# Patient Record
Sex: Male | Born: 1995 | Race: White | Hispanic: No | Marital: Married | Smoking: Former smoker
Health system: Southern US, Community
[De-identification: ages and names within clinical notes are randomized; demographics above are authoritative.]

## PROBLEM LIST (undated history)

## (undated) DIAGNOSIS — F419 Anxiety disorder, unspecified: Secondary | ICD-10-CM

## (undated) DIAGNOSIS — E119 Type 2 diabetes mellitus without complications: Secondary | ICD-10-CM

## (undated) DIAGNOSIS — R197 Diarrhea, unspecified: Secondary | ICD-10-CM

## (undated) DIAGNOSIS — K589 Irritable bowel syndrome without diarrhea: Secondary | ICD-10-CM

## (undated) HISTORY — DX: Irritable bowel syndrome, unspecified: K58.9

## (undated) HISTORY — DX: Type 2 diabetes mellitus without complications: E11.9

## (undated) HISTORY — DX: Diarrhea, unspecified: R19.7

## (undated) HISTORY — PX: WISDOM TOOTH EXTRACTION: SHX21

## (undated) HISTORY — PX: TONSILLECTOMY: SUR1361

## (undated) HISTORY — DX: Anxiety disorder, unspecified: F41.9

---
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
## 2014-02-06 ENCOUNTER — Observation Stay: Payer: Self-pay | Admitting: Internal Medicine

## 2014-02-06 LAB — COMPREHENSIVE METABOLIC PANEL
ALT: 37 U/L (ref 12–78)
AST: 17 U/L (ref 15–37)
Albumin: 4.3 g/dL (ref 3.4–5.0)
Alkaline Phosphatase: 96 U/L
Anion Gap: 4 — ABNORMAL LOW (ref 7–16)
BILIRUBIN TOTAL: 0.8 mg/dL (ref 0.2–1.0)
BUN: 9 mg/dL (ref 7–18)
CALCIUM: 9.1 mg/dL (ref 8.5–10.1)
Chloride: 108 mmol/L — ABNORMAL HIGH (ref 98–107)
Co2: 27 mmol/L (ref 21–32)
Creatinine: 0.89 mg/dL (ref 0.60–1.30)
EGFR (African American): >60
EGFR (Non-African Amer.): >60
Glucose: 91 mg/dL (ref 65–99)
Osmolality: 276 (ref 275–301)
POTASSIUM: 3.8 mmol/L (ref 3.5–5.1)
SODIUM: 139 mmol/L (ref 136–145)
Total Protein: 7.6 g/dL (ref 6.4–8.2)

## 2014-02-06 LAB — URINALYSIS, COMPLETE
BACTERIA: NONE SEEN
Bilirubin,UR: NEGATIVE
Blood: NEGATIVE
Glucose,UR: NEGATIVE mg/dL (ref 0–75)
Ketone: NEGATIVE
LEUKOCYTE ESTERASE: NEGATIVE
Nitrite: NEGATIVE
PROTEIN: NEGATIVE
Ph: 7 (ref 4.5–8.0)
SPECIFIC GRAVITY: 1.02 (ref 1.003–1.030)
Squamous Epithelial: NONE SEEN
WBC UR: NONE SEEN /HPF (ref 0–5)

## 2014-02-06 LAB — CBC WITH DIFFERENTIAL/PLATELET
BASOS PCT: 0.3 %
Basophil #: 0 10*3/uL (ref 0.0–0.1)
EOS ABS: 0.1 10*3/uL (ref 0.0–0.7)
EOS PCT: 1.8 %
HCT: 46.8 % (ref 40.0–52.0)
HGB: 15.9 g/dL (ref 13.0–18.0)
LYMPHS ABS: 2 10*3/uL (ref 1.0–3.6)
LYMPHS PCT: 24.8 %
MCH: 27.9 pg (ref 26.0–34.0)
MCHC: 33.9 g/dL (ref 32.0–36.0)
MCV: 82 fL (ref 80–100)
MONOS PCT: 7 %
Monocyte #: 0.6 x10 3/mm (ref 0.2–1.0)
NEUTROS ABS: 5.2 10*3/uL (ref 1.4–6.5)
NEUTROS PCT: 66.1 %
PLATELETS: 209 10*3/uL (ref 150–440)
RBC: 5.7 10*6/uL (ref 4.40–5.90)
RDW: 12.7 % (ref 11.5–14.5)
WBC: 7.9 10*3/uL (ref 3.8–10.6)

## 2014-02-06 LAB — LIPID PANEL
CHOLESTEROL: 140 mg/dL (ref 0–200)
HDL Cholesterol: 27 mg/dL — ABNORMAL LOW (ref 40–60)
Ldl Cholesterol, Calc: 96 mg/dL (ref 0–100)
Triglycerides: 83 mg/dL (ref 0–200)
VLDL Cholesterol, Calc: 17 mg/dL (ref 5–40)

## 2014-02-06 LAB — LIPASE, BLOOD: LIPASE: 1305 U/L — AB (ref 73–393)

## 2014-02-07 LAB — BASIC METABOLIC PANEL
Anion Gap: 7 (ref 7–16)
BUN: 7 mg/dL (ref 7–18)
CALCIUM: 8.6 mg/dL (ref 8.5–10.1)
Chloride: 106 mmol/L (ref 98–107)
Co2: 26 mmol/L (ref 21–32)
Creatinine: 1.01 mg/dL (ref 0.60–1.30)
EGFR (Non-African Amer.): >60
GLUCOSE: 68 mg/dL (ref 65–99)
Osmolality: 274 (ref 275–301)
Potassium: 3.4 mmol/L — ABNORMAL LOW (ref 3.5–5.1)
Sodium: 139 mmol/L (ref 136–145)

## 2014-02-07 LAB — LIPID PANEL
CHOLESTEROL: 116 mg/dL (ref 0–200)
HDL Cholesterol: 22 mg/dL — ABNORMAL LOW (ref 40–60)
LDL CHOLESTEROL, CALC: 76 mg/dL (ref 0–100)
Triglycerides: 89 mg/dL (ref 0–200)
VLDL CHOLESTEROL, CALC: 18 mg/dL (ref 5–40)

## 2014-02-07 LAB — LIPASE, BLOOD: LIPASE: 651 U/L — AB (ref 73–393)

## 2014-02-10 ENCOUNTER — Other Ambulatory Visit (HOSPITAL_COMMUNITY): Payer: Self-pay | Admitting: Unknown Physician Specialty

## 2014-02-10 ENCOUNTER — Encounter (HOSPITAL_COMMUNITY): Payer: Self-pay

## 2014-02-10 ENCOUNTER — Ambulatory Visit (HOSPITAL_COMMUNITY): Payer: Managed Care, Other (non HMO)

## 2014-02-10 ENCOUNTER — Ambulatory Visit (HOSPITAL_COMMUNITY)
Admission: RE | Admit: 2014-02-10 | Discharge: 2014-02-10 | Disposition: A | Discharge status: Home / Self Care | Payer: Managed Care, Other (non HMO) | Source: Ambulatory Visit | Attending: Unknown Physician Specialty | Admitting: Unknown Physician Specialty

## 2014-02-10 DIAGNOSIS — R109 Unspecified abdominal pain: Secondary | ICD-10-CM | POA: Insufficient documentation

## 2014-02-10 DIAGNOSIS — R935 Abnormal findings on diagnostic imaging of other abdominal regions, including retroperitoneum: Secondary | ICD-10-CM

## 2014-02-10 DIAGNOSIS — R197 Diarrhea, unspecified: Secondary | ICD-10-CM | POA: Insufficient documentation

## 2014-02-10 DIAGNOSIS — R630 Anorexia: Secondary | ICD-10-CM | POA: Insufficient documentation

## 2014-02-10 MED ORDER — IOHEXOL 300 MG/ML  SOLN
100.0000 mL | Freq: Once | INTRAMUSCULAR | Status: AC | PRN
  Administered 2014-02-10: 100 mL via INTRAVENOUS

## 2014-03-24 ENCOUNTER — Ambulatory Visit: Payer: Self-pay | Admitting: Unknown Physician Specialty

## 2014-09-02 IMAGING — NM NUCLEAR MEDICINE HEPATOHBILIARY INCLUDE GB
2 series · 16 of 16 positions shown · non-contrast
Comparison: CT 02/10/2014.

CLINICAL DATA: History of pancreatitis.  RIGHT upper quadrant pain.

EXAM:
NUCLEAR MEDICINE HEPATOBILIARY IMAGING WITH GALLBLADDER EF
TECHNIQUE: Sequential images of the abdomen were obtained [DATE] minutes
following intravenous administration of radiopharmaceutical. After
slow intravenous infusion of 1.66 micrograms Cholecystokinin,
gallbladder ejection fraction was determined.
RADIOPHARMACEUTICALS:  8.36 Millicurie Qc-11m Choletec

[Series 1000: gallbladder ef · 4.80mm/px · 6 of 120 frames shown]
[frame 11/120]
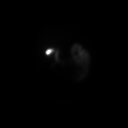
[frame 31/120]
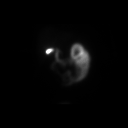
[frame 51/120]
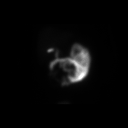
[frame 71/120]
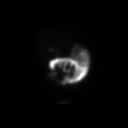
[frame 91/120]
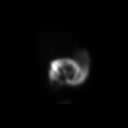
[frame 111/120]
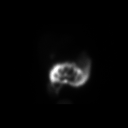

[Series 1000: gallbladder statics · 4.80mm/px · 10 of 10 slices shown]
[im 1/10]
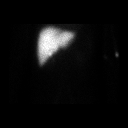
[im 2/10]
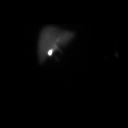
[im 3/10]
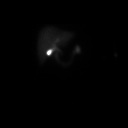
[im 4/10]
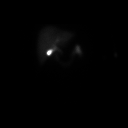
[im 5/10]
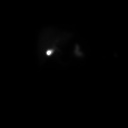
[im 6/10]
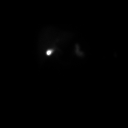
[im 7/10]
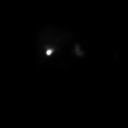
[im 8/10]
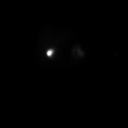
[im 9/10]
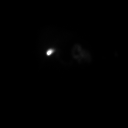
[im 10/10]
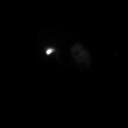

[16 of 16 positions shown; findings below may reference images not displayed]

FINDINGS: Normal hepatic radiotracer uptake. Normal filling of the common bile
duct and gallbladder. Common duct activity and gallbladder activity
were visualized at 3 min. Normal spill of radiotracer in the bowel.
Normal gallbladder ejection fraction with CCK administration.
Gallbladder ejection fraction was 95% at 30 min and 99% at 1 hr. At
30 min, normal ejection fraction is greater than 30%.

The patient did not symptoms during CCK infusion.
IMPRESSION: Normal HIDA scan.

## 2014-12-25 NOTE — H&P (Signed)
PATIENT NAME:  Juan Gonzales, BERENGUER MR#:  [id number] DATE OF BIRTH:  [birth year]  DATE OF ADMISSION:  02/06/2014  PRIMARY CARE PROVIDER:  Nonlocal.   CHIEF COMPLAINT:  Right upper quadrant abdominal pain.   HISTORY OF PRESENT ILLNESS:  The patient is a 19 year old white male with a history of irritable bowel syndrome who presents with abdominal pain ongoing for the past few days.  The patient thought that initially was related to his irritable bowel syndrome cramping; however, his pain continued to persist.  He reports the pain to be a constant dull pain on the right upper quadrant of his abdomen.  He had a laboratory evaluation in the ED which showed that his lipase was elevated and we are asked to admit the patient.  The patient reports that he has not had any nausea, vomiting or diarrhea.  He drinks beer once in a while socially.  He otherwise denies any previous history of pancreatitis.   PAST MEDICAL HISTORY:  History of irritable bowel syndrome.   PAST SURGICAL HISTORY:  Tonsillectomy.   ALLERGIES:  None.   MEDICATIONS:  None.   SOCIAL HISTORY:  Smokes less than 1 pack per day and drinks occasionally.  Uses marijuana from time to time.   FAMILY HISTORY:  Mother status post cholecystectomy due to gallstone related problems.   REVIEW OF SYSTEMS:  CONSTITUTIONAL:  Denies any fevers, fatigue.  Complains of abdominal pain.  EYES:  No blurred or double vision.  No pain.  No redness, no inflammation.  No glaucoma.  No cataracts.  EARS, NOSE, THROAT:  No tinnitus.  No ear pain.  No hearing loss.  No seasonal or year-round allergies.  RESPIRATORY:  Denies any cough, wheezing, hemoptysis.  CARDIOVASCULAR:  Denies any chest pain, orthopnea, edema.  GASTROINTESTINAL:  Denies any nausea, vomiting, diarrhea.  Complains of abdominal pain, has a history of irritable bowel syndrome. GENITOURINARY:  Denies any dysuria, hematuria, renal calculus. ENDOCRINE:  Denies any polyuria or nocturia.  HEMATOLOGIC  AND LYMPHATIC:  Denies anemia, easy bruisability or bleeding.  SKIN:  No acne.  No rash.  MUSCULOSKELETAL:  No pain in the neck, back, or shoulder. NEUROLOGIC:  No numbness, CVA, TIA.  PSYCHIATRIC:  No anxiety, insomnia.   PHYSICAL EXAMINATION: VITAL SIGNS:  Temperature 97.8, pulse 82, respirations 18, blood pressure 141/72, O2 99%.  GENERAL:  The patient is a well-developed, well-nourished male in no acute distress.  HEENT:  Head atraumatic, normocephalic.  Pupils equally round, reactive to light and accommodation.  There is no conjunctival pallor.  No scleral icterus.  Nasal exam shows no drainage or ulceration.  Oropharynx is clear without any exudate.  NECK:  Supple without any JVD.  CARDIOVASCULAR:  Regular rate and rhythm.  No murmurs, rubs, clicks, or gallops.  LUNGS:  Clear to auscultation bilaterally without any rales, rhonchi, wheezing.  ABDOMEN:  Right upper quadrant tenderness.  No guarding.  No rebound.  EXTREMITIES:  No clubbing, cyanosis, or edema.  SKIN:  No rash.  LYMPHATICS:  No lymph nodes palpable.  VASCULAR:  Good DP, PT pulses.  PSYCHIATRIC:  Not anxious or depressed.  NEUROLOGIC:  Awake, alert, oriented x 3.  No focal deficits.   LABORATORY, DIAGNOSTIC AND RADIOLOGICAL DATA:  Right upper quadrant ultrasound shows no evidence of cholelithiasis or biliary dilation.  Glucose 91, BUN 9, creatinine 0.89.  Sodium 139, potassium 3.8, chloride 108, CO2 27, lipase 1305.  LFTs are normal.  WBC 7.9, hemoglobin 15.9, platelet count 209.   ASSESSMENT AND  PLAN:  The patient is a 19 year old white male with a history of irritable bowel syndrome, presents with abdominal pain, noted to have elevated lipase.  1.  Abdominal pain, likely due to acute pancreatitis, etiology unclear.  At this time, we will keep him nothing by mouth.  We will get gastroenterology to come evaluate the patient.  Differential diagnosis could be hypertriglyceridemia.  We will check a fasting lipid panel in the  morning.  The patient will probably need serological evaluation for autoimmune pancreatitis.  CT of the abdomen is currently pending to further evaluate the pancreas.  2.  Nicotine addiction.  The patient counseled regarding smoking cessation, four minutes spent.  Nicotine patch offered and the patient will be started on a nicotine patch.   TIME SPENT:  35 minutes.    ____________________________ Lacie ScottsShreyang H. Allena KatzPatel, MD shp:ea D: 02/06/2014 21:51:59 ET T: 02/06/2014 23:11:12 ET JOB#: [id number]  cc: Adilynn Bessey H. Allena KatzPatel, MD, <Dictator> Charise CarwinSHREYANG H Noha Karasik MD ELECTRONICALLY SIGNED 02/16/2014 10:27

## 2014-12-25 NOTE — Discharge Summary (Signed)
PATIENT NAME:  Juan FloridaMURRAY, Sharad N MR#:  [id number] DATE OF BIRTH:  13-Aug-1996  DATE OF ADMISSION:  02/06/2014 DATE OF DISCHARGE:  02/07/2014  For a detailed note, please look at the history and physical done on admission by Dr. Auburn BilberryShreyang Patel.  DIAGNOSES AT DISCHARGE: Is as follows: 1. Suspected acute pancreatitis now ruled out.  2. History of irritable bowel syndrome with chronic diarrhea.   DIET: The patient is being discharged home on a regular diet.   ACTIVITY: As tolerated.   FOLLOWUP: With Dr. Lonie PeakNathan Conroy in the next 1 to 2 weeks.   DISCHARGE MEDICATIONS: Certrizine 10 mg daily, dicyclomine 10 mg 1 to 2 tabs t.i.d. as needed for diarrhea.   PERTINENT STUDIES DONE DURING THE HOSPITAL COURSE: Are as follows: A CT scan of the abdomen and pelvis done with contrast showing no evidence of acute pancreatitis or acute hepatobiliary abnormality. An ultrasound of the abdomen, limited, showing negative. No evidence of cholelithiasis or biliary dilatation. No evidence of pancreatitis.   BRIEF HOSPITAL COURSE: This is a 19 year old male who presented to the hospital with right upper quadrant abdominal pain and elevated lipase.   PROBLEMS: 1. Suspected acute pancreatitis. This was the clinical diagnosis on admission given the patient's abdominal pain and slightly elevated lipase. The patient was admitted to the hospital, started on supportive care with IV fluids, antiemetics, pain control. Although patient's imaging studies were not consistent with pancreatitis. The patient had no evidence of recent alcohol abuse. His triglyceride levels were normal. His imaging studies did not show any evidence of acute biliary pathology. Overnight, the patient's lipase did come down dramatically. He had no further pain; therefore, his diet was slowly advanced from a liquid eventually to a regular diet, which he is tolerating with no evidence of any nausea, vomiting, or any further abdominal pain. He was therefore  discharged home.  2. Irritable bowel syndrome with chronic diarrhea. The patient is on Bentyl which he will continue. He follows up with Dr. Mechele CollinElliott. He will continue to follow up with GI as an outpatient.   CODE STATUS: The patient is a full code.   TIME SPENT ON DISCHARGE: 35 minutes.     ____________________________ Rolly PancakeVivek J. Cherlynn KaiserSainani, MD vjs:lt D: 02/07/2014 14:47:19 ET T: 02/08/2014 02:13:05 ET JOB#: [id number]  cc: Rolly PancakeVivek J. Cherlynn KaiserSainani, MD, <Dictator> Lonie PeakNathan Conroy, MD Houston SirenVIVEK J SAINANI MD ELECTRONICALLY SIGNED 03/02/2014 20:38

## 2022-04-12 ENCOUNTER — Other Ambulatory Visit: Payer: Self-pay | Admitting: Family

## 2022-04-12 DIAGNOSIS — R197 Diarrhea, unspecified: Secondary | ICD-10-CM

## 2022-04-12 DIAGNOSIS — R110 Nausea: Secondary | ICD-10-CM

## 2022-04-12 DIAGNOSIS — R1011 Right upper quadrant pain: Secondary | ICD-10-CM

## 2022-04-13 ENCOUNTER — Ambulatory Visit
Admission: RE | Admit: 2022-04-13 | Discharge: 2022-04-13 | Disposition: A | Discharge status: Home / Self Care | Payer: BLUE CROSS/BLUE SHIELD | Source: Ambulatory Visit | Attending: Family | Admitting: Family

## 2022-04-13 DIAGNOSIS — R197 Diarrhea, unspecified: Secondary | ICD-10-CM | POA: Diagnosis present

## 2022-04-13 DIAGNOSIS — R110 Nausea: Secondary | ICD-10-CM

## 2022-04-13 DIAGNOSIS — R1011 Right upper quadrant pain: Secondary | ICD-10-CM | POA: Diagnosis present

## 2022-04-16 ENCOUNTER — Other Ambulatory Visit: Payer: Self-pay

## 2022-11-28 DIAGNOSIS — R140 Abdominal distension (gaseous): Secondary | ICD-10-CM | POA: Diagnosis not present

## 2022-11-28 DIAGNOSIS — K219 Gastro-esophageal reflux disease without esophagitis: Secondary | ICD-10-CM | POA: Diagnosis not present

## 2022-11-28 DIAGNOSIS — R1314 Dysphagia, pharyngoesophageal phase: Secondary | ICD-10-CM | POA: Diagnosis not present

## 2022-11-28 DIAGNOSIS — K582 Mixed irritable bowel syndrome: Secondary | ICD-10-CM | POA: Diagnosis not present

## 2022-11-28 DIAGNOSIS — R143 Flatulence: Secondary | ICD-10-CM | POA: Diagnosis not present

## 2022-11-28 DIAGNOSIS — R110 Nausea: Secondary | ICD-10-CM | POA: Diagnosis not present

## 2023-01-17 ENCOUNTER — Ambulatory Visit: Payer: 59 | Admitting: Nurse Practitioner

## 2023-01-17 ENCOUNTER — Other Ambulatory Visit: Payer: Self-pay

## 2023-01-17 ENCOUNTER — Encounter: Payer: Self-pay | Admitting: Nurse Practitioner

## 2023-01-17 VITALS — BP 122/78 | HR 84 | Temp 97.8°F | Resp 18 | Ht 71.0 in | Wt 213.5 lb

## 2023-01-17 DIAGNOSIS — G8929 Other chronic pain: Secondary | ICD-10-CM

## 2023-01-17 DIAGNOSIS — N529 Male erectile dysfunction, unspecified: Secondary | ICD-10-CM

## 2023-01-17 DIAGNOSIS — Z130 Encounter for screening for diseases of the blood and blood-forming organs and certain disorders involving the immune mechanism: Secondary | ICD-10-CM | POA: Diagnosis not present

## 2023-01-17 DIAGNOSIS — Z7189 Other specified counseling: Secondary | ICD-10-CM

## 2023-01-17 DIAGNOSIS — E1165 Type 2 diabetes mellitus with hyperglycemia: Secondary | ICD-10-CM

## 2023-01-17 DIAGNOSIS — Z131 Encounter for screening for diabetes mellitus: Secondary | ICD-10-CM

## 2023-01-17 DIAGNOSIS — J3089 Other allergic rhinitis: Secondary | ICD-10-CM

## 2023-01-17 DIAGNOSIS — F331 Major depressive disorder, recurrent, moderate: Secondary | ICD-10-CM | POA: Diagnosis not present

## 2023-01-17 DIAGNOSIS — R200 Anesthesia of skin: Secondary | ICD-10-CM | POA: Diagnosis not present

## 2023-01-17 DIAGNOSIS — K589 Irritable bowel syndrome without diarrhea: Secondary | ICD-10-CM | POA: Diagnosis not present

## 2023-01-17 DIAGNOSIS — F411 Generalized anxiety disorder: Secondary | ICD-10-CM

## 2023-01-17 DIAGNOSIS — M25512 Pain in left shoulder: Secondary | ICD-10-CM

## 2023-01-17 DIAGNOSIS — Z13220 Encounter for screening for lipoid disorders: Secondary | ICD-10-CM | POA: Diagnosis not present

## 2023-01-17 DIAGNOSIS — Z1159 Encounter for screening for other viral diseases: Secondary | ICD-10-CM

## 2023-01-17 DIAGNOSIS — Z114 Encounter for screening for human immunodeficiency virus [HIV]: Secondary | ICD-10-CM | POA: Diagnosis not present

## 2023-01-17 MED ORDER — SERTRALINE HCL 25 MG PO TABS
25.0000 mg | ORAL_TABLET | Freq: Every day | ORAL | 0 refills | Status: DC
Start: 2023-01-17 — End: 2023-03-01

## 2023-01-17 NOTE — Assessment & Plan Note (Signed)
established with gi

## 2023-01-17 NOTE — Assessment & Plan Note (Signed)
Start zoloft 25 mg daily 

## 2023-01-17 NOTE — Progress Notes (Signed)
BP 122/78   Pulse 84   Temp 97.8 F (36.6 C) (Oral)   Resp 18   Ht 5\' 11"  (1.803 m)   Wt 213 lb 8 oz (96.8 kg)   SpO2 96%   BMI 29.78 kg/m    Subjective:    Patient ID: Juan Gonzales, male    DOB: 12-Feb-1996, 27 y.o.   MRN: [id number]  HPI: Juan Gonzales is a 27 y.o. male  Chief Complaint  Patient presents with   Establish Care   Diabetes   Depression   Sperm Analysis   Establish care: his last physical was in October.  Medical history includes  Diabetes, allergies, depression, anxiety, chronic nausea, ibs, left shoulder pain, left arm numbness and low sperm count.   Health Maintenance due for labs.   Diabetes : he says he had an A1C of 12 in 2021. He says he was on metformin for about a month but did not tolerate it. His last A1C was 5.5 on 06/21/2021. He reports he was told he had diabetes.  He is not currently on medication.  He says he had a physical  in October and it was 5.2. will get labs.   Allergies:  he reports his allergies have been really bad. He says he has tried claritin and zyrtec.   He says that he has tried flonase.  He would like to see an allergist.  Referral placed.   Depression/anxiety: his PHQ9 and GAD scores are elevated.   he reports that he has tried zoloft, prozac, and other medications but has a hard time sticking with it.  He has agreed to start zoloft 25 mg daily. He will follow up in 4 weeks.      01/17/2023    2:38 PM  Depression screen PHQ 2/9  Decreased Interest 2  Down, Depressed, Hopeless 2  PHQ - 2 Score 4  Altered sleeping 3  Tired, decreased energy 3  Change in appetite 2  Feeling bad or failure about yourself  3  Trouble concentrating 3  Moving slowly or fidgety/restless 0  Suicidal thoughts 0  PHQ-9 Score 18  Difficult doing work/chores Somewhat difficult       01/17/2023    2:40 PM  GAD 7 : Generalized Anxiety Score  Nervous, Anxious, on Edge 3  Control/stop worrying 3  Worry too much - different things 3   Trouble relaxing 3  Restless 0  Easily annoyed or irritable 1  Afraid - awful might happen 3  Total GAD 7 Score 16  Anxiety Difficulty Somewhat difficult   Chronic nausea/globus sensation/IBS: he says that he has a globus sensation for about a year. He says this has caused a lot of nausea.   He has an appointment for endoscopy.  He is currently seeing GI [facility] clinic.    Left arm numbness/left shoulder pain:  he says that his left arm numbness.  He says that it has been going on for about 4 months. He has had chronic shoulder problems.  He has a torn rotator cuff.  Recommend going back to orthopedic.    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  He went to a fertility clinic and was tested.  He says that he would like to have that rechecked and he would also like to have his testosterone checked.   Relevant past medical, surgical, family and social history reviewed and updated as indicated. Interim medical history since our last  visit reviewed. Allergies and medications reviewed and updated.  Review of Systems  Constitutional: Negative for fever or weight change.  Respiratory: Negative for cough and shortness of breath.   Cardiovascular: Negative for chest pain or palpitations.  Gastrointestinal: Negative for abdominal pain, no bowel changes.  Musculoskeletal: Negative for gait problem or joint swelling.  Skin: Negative for rash.  Neurological: Negative for dizziness or headache.  No other specific complaints in a complete review of systems (except as listed in HPI above).      Objective:    BP 122/78   Pulse 84   Temp 97.8 F (36.6 C) (Oral)   Resp 18   Ht 5\' 11"  (1.803 m)   Wt 213 lb 8 oz (96.8 kg)   SpO2 96%   BMI 29.78 kg/m   Wt Readings from Last 3 Encounters:  01/17/23 213 lb 8 oz (96.8 kg)  07/09/11 196 lb (88.9 kg) (94 %, Z= 1.54)*  05/09/11 197 lb (89.4 kg) (94 %, Z= 1.59)*   * Growth percentiles are based on CDC  (Boys, 2-20 Years) data.    Physical Exam  Constitutional: Patient appears well-developed and well-nourished. Obese  No distress.  HEENT: head atraumatic, normocephalic, pupils equal and reactive to light, neck supple Cardiovascular: Normal rate, regular rhythm and normal heart sounds.  No murmur heard. No BLE edema. Pulmonary/Chest: Effort normal and breath sounds normal. No respiratory distress. Abdominal: Soft.  There is no tenderness. Psychiatric: Patient has a normal mood and affect. behavior is normal. Judgment and thought content normal.     Assessment & Plan:   Problem List Items Addressed This Visit       Digestive   Irritable bowel syndrome (IBS)    established with gi         Other   GAD (generalized anxiety disorder)    Start zoloft 25 mg daily      Relevant Medications   ALPRAZolam (NIRAVAM) 0.25 MG dissolvable tablet   sertraline (ZOLOFT) 25 MG tablet   Moderate episode of recurrent major depressive disorder (HCC)    Start zoloft 25 mg daily      Relevant Medications   ALPRAZolam (NIRAVAM) 0.25 MG dissolvable tablet   sertraline (ZOLOFT) 25 MG tablet   Other Visit Diagnoses     Type 2 diabetes mellitus with hyperglycemia, without long-term current use of insulin (HCC)    -  Primary   Relevant Orders   COMPLETE METABOLIC PANEL WITH GFR   Hemoglobin A1c   Microalbumin / creatinine urine ratio   Encounter for HIV screening and discussion of pre-exposure prophylaxis for HIV       Relevant Orders   HIV Antibody (routine testing w rflx)   Need for hepatitis C screening test       Relevant Orders   Hepatitis C antibody   Screening for diabetes mellitus       Relevant Orders   COMPLETE METABOLIC PANEL WITH GFR   Hemoglobin A1c   Screening for deficiency anemia       Relevant Orders   CBC with Differential/Platelet   Screening for cholesterol level       Relevant Orders   Lipid panel   Chronic left shoulder pain       referral placed to orthopedic    Relevant Medications   sertraline (ZOLOFT) 25 MG tablet   Other Relevant Orders   Ambulatory referral to Orthopedic Surgery   Allergic rhinitis due to other allergic trigger, unspecified seasonality  referral for allergy testing placed   Relevant Orders   Ambulatory referral to Allergy   Erectile dysfunction, unspecified erectile dysfunction type       reports difficulty with erection and was told he had a low sperm count,would like testosterone checked.   Relevant Orders   Testosterone   TSH   Arm numbness left       referral placed to orthopedic   Relevant Orders   Vitamin B12        Follow up plan: Return in about 4 weeks (around 02/14/2023) for follow up.

## 2023-01-18 ENCOUNTER — Encounter: Payer: Self-pay | Admitting: Nurse Practitioner

## 2023-01-18 LAB — COMPLETE METABOLIC PANEL WITH GFR
BUN: 13 mg/dL (ref 7–25)
Globulin: 2.8 g/dL (calc) (ref 1.9–3.7)

## 2023-01-18 LAB — HEMOGLOBIN A1C: Hgb A1c MFr Bld: 5.4 % of total Hgb (ref <5.7)

## 2023-01-18 LAB — LIPID PANEL
Non-HDL Cholesterol (Calc): 145 mg/dL (calc) — ABNORMAL HIGH (ref <130)
Total CHOL/HDL Ratio: 5.5 (calc) — ABNORMAL HIGH (ref <5.0)

## 2023-01-18 LAB — HIV ANTIBODY (ROUTINE TESTING W REFLEX): HIV 1&2 Ab, 4th Generation: NONREACTIVE

## 2023-01-19 LAB — MICROALBUMIN / CREATININE URINE RATIO
Creatinine, Urine: 228 mg/dL (ref 20–320)
Microalb Creat Ratio: 7 mg/g creat (ref <30)
Microalb, Ur: 1.6 mg/dL

## 2023-01-19 LAB — CBC WITH DIFFERENTIAL/PLATELET
Absolute Monocytes: 432 cells/uL (ref 200–950)
Basophils Absolute: 60 cells/uL (ref 0–200)
Basophils Relative: 1 %
Eosinophils Absolute: 138 cells/uL (ref 15–500)
Eosinophils Relative: 2.3 %
HCT: 51.2 % — ABNORMAL HIGH (ref 38.5–50.0)
Hemoglobin: 17.2 g/dL — ABNORMAL HIGH (ref 13.2–17.1)
Lymphs Abs: 1806 cells/uL (ref 850–3900)
MCH: 27.2 pg (ref 27.0–33.0)
MCHC: 33.6 g/dL (ref 32.0–36.0)
MCV: 81 fL (ref 80.0–100.0)
MPV: 9.5 fL (ref 7.5–12.5)
Monocytes Relative: 7.2 %
Neutro Abs: 3564 cells/uL (ref 1500–7800)
Neutrophils Relative %: 59.4 %
Platelets: 240 10*3/uL (ref 140–400)
RBC: 6.32 10*6/uL — ABNORMAL HIGH (ref 4.20–5.80)
RDW: 12.3 % (ref 11.0–15.0)
Total Lymphocyte: 30.1 %
WBC: 6 10*3/uL (ref 3.8–10.8)

## 2023-01-19 LAB — TESTOSTERONE: Testosterone: 276 ng/dL (ref 250–827)

## 2023-01-19 LAB — LIPID PANEL
Cholesterol: 177 mg/dL (ref <200)
HDL: 32 mg/dL — ABNORMAL LOW (ref >=40)
LDL Cholesterol (Calc): 123 mg/dL (calc) — ABNORMAL HIGH
Triglycerides: 111 mg/dL (ref <150)

## 2023-01-19 LAB — COMPLETE METABOLIC PANEL WITH GFR
AG Ratio: 1.8 (calc) (ref 1.0–2.5)
ALT: 27 U/L (ref 9–46)
AST: 17 U/L (ref 10–40)
Albumin: 5 g/dL (ref 3.6–5.1)
Alkaline phosphatase (APISO): 63 U/L (ref 36–130)
CO2: 25 mmol/L (ref 20–32)
Calcium: 9.6 mg/dL (ref 8.6–10.3)
Chloride: 106 mmol/L (ref 98–110)
Creat: 0.92 mg/dL (ref 0.60–1.24)
Glucose, Bld: 101 mg/dL — ABNORMAL HIGH (ref 65–99)
Potassium: 4.3 mmol/L (ref 3.5–5.3)
Sodium: 141 mmol/L (ref 135–146)
Total Bilirubin: 0.8 mg/dL (ref 0.2–1.2)
Total Protein: 7.8 g/dL (ref 6.1–8.1)
eGFR: 115 mL/min/{1.73_m2} (ref >=60)

## 2023-01-19 LAB — HEMOGLOBIN A1C
Mean Plasma Glucose: 108 mg/dL
eAG (mmol/L): 6 mmol/L

## 2023-01-19 LAB — TSH: TSH: 1.15 mIU/L (ref 0.40–4.50)

## 2023-01-19 LAB — HEPATITIS C ANTIBODY: Hepatitis C Ab: NONREACTIVE

## 2023-01-19 LAB — VITAMIN B12: Vitamin B-12: 440 pg/mL (ref 200–1100)

## 2023-01-21 ENCOUNTER — Other Ambulatory Visit: Payer: Self-pay | Admitting: Nurse Practitioner

## 2023-01-21 DIAGNOSIS — N529 Male erectile dysfunction, unspecified: Secondary | ICD-10-CM

## 2023-01-29 ENCOUNTER — Encounter: Payer: Self-pay | Admitting: Internal Medicine

## 2023-01-30 ENCOUNTER — Ambulatory Visit: Payer: 59 | Admitting: Certified Registered"

## 2023-01-30 ENCOUNTER — Encounter
Admission: RE | Disposition: A | Discharge status: Home / Self Care | Payer: Self-pay | Source: Home / Self Care | Attending: Internal Medicine

## 2023-01-30 ENCOUNTER — Ambulatory Visit
Admission: RE | Admit: 2023-01-30 | Discharge: 2023-01-30 | Disposition: A | Discharge status: Home / Self Care | Payer: 59 | Attending: Internal Medicine | Admitting: Internal Medicine

## 2023-01-30 ENCOUNTER — Encounter: Payer: Self-pay | Admitting: Internal Medicine

## 2023-01-30 ENCOUNTER — Other Ambulatory Visit: Payer: Self-pay

## 2023-01-30 DIAGNOSIS — F329 Major depressive disorder, single episode, unspecified: Secondary | ICD-10-CM | POA: Diagnosis not present

## 2023-01-30 DIAGNOSIS — E119 Type 2 diabetes mellitus without complications: Secondary | ICD-10-CM | POA: Diagnosis not present

## 2023-01-30 DIAGNOSIS — R1310 Dysphagia, unspecified: Secondary | ICD-10-CM | POA: Diagnosis not present

## 2023-01-30 DIAGNOSIS — R1314 Dysphagia, pharyngoesophageal phase: Secondary | ICD-10-CM | POA: Diagnosis not present

## 2023-01-30 DIAGNOSIS — R110 Nausea: Secondary | ICD-10-CM | POA: Insufficient documentation

## 2023-01-30 DIAGNOSIS — K589 Irritable bowel syndrome without diarrhea: Secondary | ICD-10-CM | POA: Diagnosis not present

## 2023-01-30 DIAGNOSIS — K2289 Other specified disease of esophagus: Secondary | ICD-10-CM | POA: Insufficient documentation

## 2023-01-30 DIAGNOSIS — F419 Anxiety disorder, unspecified: Secondary | ICD-10-CM | POA: Diagnosis not present

## 2023-01-30 DIAGNOSIS — K219 Gastro-esophageal reflux disease without esophagitis: Secondary | ICD-10-CM | POA: Diagnosis not present

## 2023-01-30 HISTORY — PX: ESOPHAGOGASTRODUODENOSCOPY (EGD) WITH PROPOFOL: SHX5813

## 2023-01-30 SURGERY — ESOPHAGOGASTRODUODENOSCOPY (EGD) WITH PROPOFOL
Anesthesia: General

## 2023-01-30 MED ORDER — ONDANSETRON HCL 4 MG/2ML IJ SOLN
INTRAMUSCULAR | Status: DC | PRN
  Administered 2023-01-30: 4 mg via INTRAVENOUS

## 2023-01-30 MED ORDER — PROPOFOL 10 MG/ML IV BOLUS
INTRAVENOUS | Status: AC
  Filled 2023-01-30: qty 20

## 2023-01-30 MED ORDER — PROPOFOL 500 MG/50ML IV EMUL
INTRAVENOUS | Status: DC | PRN
  Administered 2023-01-30: 200 ug/kg/min via INTRAVENOUS
  Administered 2023-01-30: 150 mg via INTRAVENOUS

## 2023-01-30 MED ORDER — SODIUM CHLORIDE 0.9 % IV SOLN: INTRAVENOUS | Status: DC

## 2023-01-30 MED ORDER — DEXMEDETOMIDINE HCL IN NACL 80 MCG/20ML IV SOLN
INTRAVENOUS | Status: DC | PRN
  Administered 2023-01-30: 20 ug via INTRAVENOUS

## 2023-01-30 MED ORDER — GLYCOPYRROLATE 0.2 MG/ML IJ SOLN
INTRAMUSCULAR | Status: DC | PRN
  Administered 2023-01-30: .2 mg via INTRAVENOUS

## 2023-01-30 NOTE — Transfer of Care (Signed)
Immediate Anesthesia Transfer of Care Note  Patient: Juan Gonzales  Procedure(s) Performed: ESOPHAGOGASTRODUODENOSCOPY (EGD) WITH PROPOFOL  Patient Location: PACU  Anesthesia Type:MAC  Level of Consciousness: drowsy  Airway & Oxygen Therapy: Patient Spontanous Breathing  Post-op Assessment: Report given to RN and Post -op Vital signs reviewed and stable  Post vital signs: Reviewed  Last Vitals:  Vitals Value Taken Time  BP 124/86   Temp 98   Pulse 80 01/30/23 1248  Resp 11 01/30/23 1248  SpO2 96 % 01/30/23 1248  Vitals shown include unvalidated device data.  Last Pain:  Vitals:   01/30/23 1246  TempSrc:   PainSc: 0-No pain         Complications: No notable events documented.

## 2023-01-30 NOTE — H&P (Signed)
Outpatient short stay form Pre-procedure 01/30/2023 11:53 AM Juan Gonzales K. Norma Fredrickson, M.D.  Primary Physician: Della Goo, FNP  Reason for visit:  Chronic nausea  Pharyngoesophageal dysphagia  Gastroesophageal reflux disease, unspecified whether esophagitis present   History of present illness:  He reports over the past 85-months he has been experiencing issues with chronic nausea, abdominal bloating, excessive flatulence, and alternating constipation and diarrhea. He reports he was on oral antibiotics for sinus infection prior to getting the distressing GI symptoms. There is no associated emesis with the nausea. He reports the nausea is very bothersome as he can wake up with it and feel terrible to start the day. It can make it hard to go to sleep at night. It can be worse after meals. When symptoms first started, "it felt like my abs had just done a full ab work out." He can feel like something is sitting in his throat with a lot of gas build up and then will belch and feel better. He endorses intermittent abdominal cramping and uncontrolled IBS symptoms. Bowels are 60% diarrhea and 40% constipation. He typically has 3-4 bowel movements daily with consistency similar to [facility] Stool scale 5-6. Flatulence and bloating make it hard to feel normal. He reports his stool "smells like sulfur and eggs." GERD symptoms are fairly well-controlled on PPI. He also notes a 6-8 month history of solid food dysphagia where he feels like breads, meats, or other dried thing hang up at level between cricoid cartilage and suprasternal notch. Once he drinks some liquid the food bolus will usually pass into his stomach. He denies any issues with globus sensation, odynophagia, liquid/pill dysphagia, or hoarseness. He was diagnosed with T2DM two years ago and really focused on diet. He was ~275-lbs at time of diabetes diagnosis and got down to 200-lbs. He is now between 205-215-lbs. He fasts in the morning and eats his first meal  around 2 PM. He will have snack between 5-6 PM and eat dinner at 9 PM after work. He usually goes to bed at 3 AM. He works as Corporate treasurer. He vapes daily, but reports it is 0% nicotine. He does use marijuana daily. Anxiety symptoms have been uncontrolled recently due to exacerbation of GI symptoms. His parents both have IBS. No known family history of gastrointestinal malignancies or IBD. He had EGD and colonoscopy performed by Dr. Mechele Collin 11/2012 which commented on LA Grade A reflux esophagitis, normal stomach/duodenum, normal appearing TI with negative TI biopsies, and normal examined colon with random biopsies negative for microscopic colitis. He had VC Eperformed 02/2014 which showed no structural abnormalities. 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 He was given saline bolus and Reglan 5 mg IV.     Current Facility-Administered Medications:    0.9 %  sodium chloride infusion, , Intravenous, Continuous, Kevyn Boquet, Boykin Nearing, MD  Medications Prior to Admission  Medication Sig Dispense Refill Last Dose   ALPRAZolam (NIRAVAM) 0.25 MG dissolvable tablet Take 0.25 mg by mouth at bedtime as needed for anxiety.   Past Week   sertraline (ZOLOFT) 25 MG tablet Take 1 tablet (25 mg total) by mouth daily. 30 tablet 0      Allergies  Allergen Reactions   Octacosanol    Octacosanol    Hydrocodone Rash     Past Medical History:  Diagnosis Date   Abdominal pain    Diabetes mellitus without complication (HCC)  Diarrhea    IBS (irritable bowel syndrome)    presumptive    Review of systems:  Otherwise negative.    Physical Exam  Gen: Alert, oriented. Appears stated age.  HEENT: [place]/AT. PERRLA. Lungs: CTA, no wheezes. CV: RR nl S1, S2. Abd: soft, benign, no masses. BS+ Ext: No edema. Pulses 2+    Planned procedures: Proceed with EGD. The patient understands the nature of the  planned procedure, indications, risks, alternatives and potential complications including but not limited to bleeding, infection, perforation, damage to internal organs and possible oversedation/side effects from anesthesia. The patient agrees and gives consent to proceed.  Please refer to procedure notes for findings, recommendations and patient disposition/instructions.     Tamee Battin K. Norma Fredrickson, M.D. Gastroenterology 01/30/2023  11:53 AM

## 2023-01-30 NOTE — Interval H&P Note (Signed)
History and Physical Interval Note:  01/30/2023 11:59 AM  Juan Gonzales  has presented today for surgery, with the diagnosis of  787.02 (ICD-9-CM) - R11.0 (ICD-10-CM) - Chronic nausea  787.24 (ICD-9-CM) - R13.14 (ICD-10-CM) - Pharyngoesophageal dysphagia  530.81 (ICD-9-CM) - K21.9 (ICD-10-CM) - Gastroesophageal reflux disease, unspecified whether esophagitis present.  The various methods of treatment have been discussed with the patient and family. After consideration of risks, benefits and other options for treatment, the patient has consented to  Procedure(s): ESOPHAGOGASTRODUODENOSCOPY (EGD) WITH PROPOFOL (N/A) as a surgical intervention.  The patient's history has been reviewed, patient examined, no change in status, stable for surgery.  I have reviewed the patient's chart and labs.  Questions were answered to the patient's satisfaction.     [place], [place]

## 2023-01-30 NOTE — Op Note (Addendum)
[facility] Gastroenterology Patient Name: Juan Gonzales Procedure Date: 01/30/2023 12:09 PM MRN: [id number] Account #: [id number] Date of Birth: [birth year] Admit Type: Outpatient Age: 27 Room[facility] ENDO ROOM 2 Gender: Male Note Status: Finalized Instrument Name: Upper Endoscope [id number] Procedure:             Upper GI endoscopy Indications:           Dysphagia, Suspected gastro-esophageal reflux disease Providers:             Boykin Nearing. Norma Fredrickson MD, MD Referring MD:          Rudolpho Sevin. [place] (Referring MD) Medicines:             Propofol per Anesthesia Complications:         No immediate complications. Procedure:             Pre-Anesthesia Assessment:                        - The risks and benefits of the procedure and the                         sedation options and risks were discussed with the                         patient. All questions were answered and informed                         consent was obtained.                        - Patient identification and proposed procedure were                         verified prior to the procedure by the nurse. The                         procedure was verified in the procedure room.                        - ASA Grade Assessment: II - A patient with mild                         systemic disease.                        - After reviewing the risks and benefits, the patient                         was deemed in satisfactory condition to undergo the                         procedure.                        After obtaining informed consent, the endoscope was                         passed under direct vision. Throughout the procedure,  the patient's blood pressure, pulse, and oxygen                         saturations were monitored continuously. The Endoscope                         was introduced through the mouth, and advanced to the                         third part of duodenum. The upper GI  endoscopy was                         accomplished without difficulty. The patient tolerated                         the procedure well. Findings:      The Z-line was irregular and was found 35 to 36 cm from the incisors.       Mucosa was biopsied with a cold forceps for histology. One specimen       bottle was sent to pathology.      No endoscopic abnormality was evident in the esophagus to explain the       patient's complaint of dysphagia. It was decided, however, to proceed       with dilation in the distal esophagus. The scope was withdrawn. Dilation       was performed with a Maloney dilator with no resistance at 54 Fr.      The stomach was normal.      The examined duodenum was normal. Impression:            - Z-line irregular, 35 to 36 cm from the incisors.                         Biopsied.                        - No endoscopic esophageal abnormality to explain                         patient's dysphagia. Esophagus dilated. Dilated.                        - Normal stomach.                        - Normal examined duodenum. Recommendation:        - Patient has a contact number available for                         emergencies. The signs and symptoms of potential                         delayed complications were discussed with the patient.                         Return to normal activities tomorrow. Written                         discharge instructions were provided to the patient.                        -  Resume previous diet.                        - Continue present medications.                        - Await pathology results.                        - Monitor results to esophageal dilation                        - Follow up with Jacob Moores, PA-C in the GI office.                         [phone]                        - Telephone GI office to schedule appointment in 3                         months.                        - The findings and recommendations were  discussed with                         the patient. Procedure Code(s):     --- Professional ---                        [phone], Esophagogastroduodenoscopy, flexible,                         transoral; with biopsy, single or multiple                        [phone], Dilation of esophagus, by unguided sound or                         bougie, single or multiple passes Diagnosis Code(s):     --- Professional ---                        R13.10, Dysphagia, unspecified                        K22.89, Other specified disease of esophagus CPT copyright 2022 American Medical Association. All rights reserved. The codes documented in this report are preliminary and upon coder review may  be revised to meet current compliance requirements. Stanton Kidney MD, MD 01/30/2023 12:44:28 PM This report has been signed electronically. Number of Addenda: 0 Note Initiated On: 01/30/2023 12:09 PM Estimated Blood Loss:  Estimated blood loss: none.      [facility]

## 2023-01-30 NOTE — Anesthesia Preprocedure Evaluation (Signed)
Anesthesia Evaluation  Patient identified by MRN, date of birth, ID band Patient awake    Reviewed: Allergy & Precautions, H&P , NPO status , Patient's Chart, lab work & pertinent test results, reviewed documented beta blocker date and time   Airway Mallampati: II   Neck ROM: full    Dental  (+) Poor Dentition   Pulmonary Current SmokerPatient did not abstain from smoking.   Pulmonary exam normal        Cardiovascular Exercise Tolerance: Good negative cardio ROS Normal cardiovascular exam Rhythm:regular Rate:Normal     Neuro/Psych  PSYCHIATRIC DISORDERS Anxiety Depression    negative neurological ROS     GI/Hepatic negative GI ROS, Neg liver ROS,,,  Endo/Other  negative endocrine ROSdiabetes, Well Controlled    Renal/GU negative Renal ROS  negative genitourinary   Musculoskeletal   Abdominal   Peds  Hematology negative hematology ROS (+)   Anesthesia Other Findings Past Medical History: No date: Abdominal pain No date: Diabetes mellitus without complication (HCC) No date: Diarrhea No date: IBS (irritable bowel syndrome)     Comment:  presumptive Past Surgical History: No date: TONSILLECTOMY No date: WISDOM TOOTH EXTRACTION BMI    Body Mass Index: 29.29 kg/m     Reproductive/Obstetrics negative OB ROS                             Anesthesia Physical Anesthesia Plan  ASA: 2  Anesthesia Plan: General   Post-op Pain Management:    Induction:   PONV Risk Score and Plan:   Airway Management Planned:   Additional Equipment:   Intra-op Plan:   Post-operative Plan:   Informed Consent: I have reviewed the patients History and Physical, chart, labs and discussed the procedure including the risks, benefits and alternatives for the proposed anesthesia with the patient or authorized representative who has indicated his/her understanding and acceptance.     Dental Advisory  Given  Plan Discussed with: CRNA  Anesthesia Plan Comments:        Anesthesia Quick Evaluation

## 2023-01-31 ENCOUNTER — Encounter: Payer: Self-pay | Admitting: Internal Medicine

## 2023-01-31 ENCOUNTER — Other Ambulatory Visit: Payer: Self-pay | Admitting: Nurse Practitioner

## 2023-01-31 DIAGNOSIS — F331 Major depressive disorder, recurrent, moderate: Secondary | ICD-10-CM

## 2023-01-31 DIAGNOSIS — F411 Generalized anxiety disorder: Secondary | ICD-10-CM

## 2023-01-31 NOTE — Anesthesia Postprocedure Evaluation (Signed)
Anesthesia Post Note  Patient: Juan Gonzales  Procedure(s) Performed: ESOPHAGOGASTRODUODENOSCOPY (EGD) WITH PROPOFOL  Patient location during evaluation: PACU Anesthesia Type: General Level of consciousness: awake and alert Pain management: pain level controlled Vital Signs Assessment: post-procedure vital signs reviewed and stable Respiratory status: spontaneous breathing, nonlabored ventilation, respiratory function stable and patient connected to nasal cannula oxygen Cardiovascular status: blood pressure returned to baseline and stable Postop Assessment: no apparent nausea or vomiting Anesthetic complications: no   No notable events documented.   Last Vitals:  Vitals:   01/30/23 1246 01/30/23 1256  BP: 114/73 133/81  Pulse:    Resp:    Temp:    SpO2:      Last Pain:  Vitals:   01/30/23 1306  TempSrc:   PainSc: 1                  Yevette Edwards

## 2023-01-31 NOTE — Telephone Encounter (Signed)
Unable to refill per protocol, Rx request is too soon. Last refill 01/17/23 for 30 days.  Requested Prescriptions  Pending Prescriptions Disp Refills   sertraline (ZOLOFT) 25 MG tablet [Pharmacy Med Name: SERTRALINE HCL 25 MG TABLET] 90 tablet 1    Sig: TAKE 1 TABLET (25 MG TOTAL) BY MOUTH DAILY.     Psychiatry:  Antidepressants - SSRI - sertraline Passed - 01/31/2023  9:16 AM      Passed - AST in normal range and within 360 days    AST  Date Value Ref Range Status  01/17/2023 17 10 - 40 U/L Final   SGOT(AST)  Date Value Ref Range Status  02/06/2014 17 15 - 37 Unit/L Final         Passed - ALT in normal range and within 360 days    ALT  Date Value Ref Range Status  01/17/2023 27 9 - 46 U/L Final   SGPT (ALT)  Date Value Ref Range Status  02/06/2014 37 12 - 78 U/L Final         Passed - Completed PHQ-2 or PHQ-9 in the last 360 days      Passed - Valid encounter within last 6 months    Recent Outpatient Visits           2 weeks ago Type 2 diabetes mellitus with hyperglycemia, without long-term current use of insulin [facility])   [place] [facility] Berniece Salines, FNP       Future Appointments             In 2 weeks Zane Herald, Rudolpho Sevin, FNP [facility]   In 4 weeks Stoioff, Verna Czech, MD [facility] [place]

## 2023-02-06 ENCOUNTER — Encounter: Payer: Self-pay | Admitting: Nurse Practitioner

## 2023-02-13 NOTE — Progress Notes (Deleted)
There were no vitals taken for this visit.   Subjective:    Patient ID: Juan Gonzales, male    DOB: 10-Jul-1996, 27 y.o.   MRN: [id number]  HPI: Juan Gonzales is a 27 y.o. male  No chief complaint on file.  Depression/anxiety Medication zoloft 25 mg daily Compliant *** Side effects *** PHQ9 *** GAD *** Therapy ***   Depression/anxiety: his PHQ9 and GAD scores are elevated.   he reports that he has tried zoloft, prozac, and other medications but has a hard time sticking with it.  He has agreed to start zoloft 25 mg daily. He will follow up in 4 weeks.   Relevant past medical, surgical, family and social history reviewed and updated as indicated. Interim medical history since our last visit reviewed. Allergies and medications reviewed and updated.  Review of Systems  Constitutional: Negative for fever or weight change.  Respiratory: Negative for cough and shortness of breath.   Cardiovascular: Negative for chest pain or palpitations.  Gastrointestinal: Negative for abdominal pain, no bowel changes.  Musculoskeletal: Negative for gait problem or joint swelling.  Skin: Negative for rash.  Neurological: Negative for dizziness or headache.  No other specific complaints in a complete review of systems (except as listed in HPI above).      Objective:    There were no vitals taken for this visit.  Wt Readings from Last 3 Encounters:  01/30/23 210 lb (95.3 kg)  01/17/23 213 lb 8 oz (96.8 kg)  07/09/11 196 lb (88.9 kg) (94 %, Z= 1.54)*   * Growth percentiles are based on CDC (Boys, 2-20 Years) data.    Physical Exam  Constitutional: Patient appears well-developed and well-nourished. Obese *** No distress.  HEENT: head atraumatic, normocephalic, pupils equal and reactive to light, ears ***, neck supple, throat within normal limits Cardiovascular: Normal rate, regular rhythm and normal heart sounds.  No murmur heard. No BLE edema. Pulmonary/Chest: Effort normal and  breath sounds normal. No respiratory distress. Abdominal: Soft.  There is no tenderness. Psychiatric: Patient has a normal mood and affect. behavior is normal. Judgment and thought content normal.  Results for orders placed or performed in visit on 01/17/23  Testosterone  Result Value Ref Range   Testosterone 276 250 - 827 ng/dL  CBC with Differential/Platelet  Result Value Ref Range   WBC 6.0 3.8 - 10.8 Thousand/uL   RBC 6.32 (H) 4.20 - 5.80 Million/uL   Hemoglobin 17.2 (H) 13.2 - 17.1 g/dL   HCT 40.9 (H) 81.1 - 91.4 %   MCV 81.0 80.0 - 100.0 fL   MCH 27.2 27.0 - 33.0 pg   MCHC 33.6 32.0 - 36.0 g/dL   RDW 78.2 95.6 - 21.3 %   Platelets 240 140 - 400 Thousand/uL   MPV 9.5 7.5 - 12.5 fL   Neutro Abs 3,564 1,500 - 7,800 cells/uL   Lymphs Abs 1,806 850 - 3,900 cells/uL   Absolute Monocytes 432 200 - 950 cells/uL   Eosinophils Absolute 138 15 - 500 cells/uL   Basophils Absolute 60 0 - 200 cells/uL   Neutrophils Relative % 59.4 %   Total Lymphocyte 30.1 %   Monocytes Relative 7.2 %   Eosinophils Relative 2.3 %   Basophils Relative 1.0 %  COMPLETE METABOLIC PANEL WITH GFR  Result Value Ref Range   Glucose, Bld 101 (H) 65 - 99 mg/dL   BUN 13 7 - 25 mg/dL   Creat 0.86 5.78 - 4.69 mg/dL   eGFR  115 > OR = 60 mL/min/1.60m2   BUN/Creatinine Ratio SEE NOTE: 6 - 22 (calc)   Sodium 141 135 - 146 mmol/L   Potassium 4.3 3.5 - 5.3 mmol/L   Chloride 106 98 - 110 mmol/L   CO2 25 20 - 32 mmol/L   Calcium 9.6 8.6 - 10.3 mg/dL   Total Protein 7.8 6.1 - 8.1 g/dL   Albumin 5.0 3.6 - 5.1 g/dL   Globulin 2.8 1.9 - 3.7 g/dL (calc)   AG Ratio 1.8 1.0 - 2.5 (calc)   Total Bilirubin 0.8 0.2 - 1.2 mg/dL   Alkaline phosphatase (APISO) 63 36 - 130 U/L   AST 17 10 - 40 U/L   ALT 27 9 - 46 U/L  Hemoglobin A1c  Result Value Ref Range   Hgb A1c MFr Bld 5.4 <5.7 % of total Hgb   Mean Plasma Glucose 108 mg/dL   eAG (mmol/L) 6.0 mmol/L  Hepatitis C antibody  Result Value Ref Range   Hepatitis C Ab  NON-REACTIVE NON-REACTIVE  HIV Antibody (routine testing w rflx)  Result Value Ref Range   HIV 1&2 Ab, 4th Generation NON-REACTIVE NON-REACTIVE  TSH  Result Value Ref Range   TSH 1.15 0.40 - 4.50 mIU/L  Lipid panel  Result Value Ref Range   Cholesterol 177 <200 mg/dL   HDL 32 (L) > OR = 40 mg/dL   Triglycerides 409 <811 mg/dL   LDL Cholesterol (Calc) 123 (H) mg/dL (calc)   Total CHOL/HDL Ratio 5.5 (H) <5.0 (calc)   Non-HDL Cholesterol (Calc) 145 (H) <130 mg/dL (calc)  Vitamin B14  Result Value Ref Range   Vitamin B-12 440 200 - 1,100 pg/mL  Microalbumin / creatinine urine ratio  Result Value Ref Range   Creatinine, Urine 228 20 - 320 mg/dL   Microalb, Ur 1.6 mg/dL   Microalb Creat Ratio 7 <30 mg/g creat      Assessment & Plan:   Problem List Items Addressed This Visit   None    Follow up plan: No follow-ups on file.

## 2023-02-14 ENCOUNTER — Ambulatory Visit: Payer: 59 | Admitting: Nurse Practitioner

## 2023-02-24 ENCOUNTER — Other Ambulatory Visit: Payer: Self-pay | Admitting: Nurse Practitioner

## 2023-02-24 DIAGNOSIS — F411 Generalized anxiety disorder: Secondary | ICD-10-CM

## 2023-02-24 DIAGNOSIS — F331 Major depressive disorder, recurrent, moderate: Secondary | ICD-10-CM

## 2023-02-26 NOTE — Telephone Encounter (Signed)
Requested medication (s) are due for refill today:   Yes  Requested medication (s) are on the active medication list:   Yes  Future visit scheduled:   No   02/14/2023 was a No Show   Last ordered: 01/17/2023 #30, 0 refills  Returned because a 90 day supply and a DX Code are being requested.     Also was a No Show for his last appt.   Requested Prescriptions  Pending Prescriptions Disp Refills   sertraline (ZOLOFT) 25 MG tablet [Pharmacy Med Name: SERTRALINE HCL 25 MG TABLET] 90 tablet 1    Sig: Take 1 tablet (25 mg total) by mouth daily.     Psychiatry:  Antidepressants - SSRI - sertraline Passed - 02/24/2023 12:30 PM      Passed - AST in normal range and within 360 days    AST  Date Value Ref Range Status  01/17/2023 17 10 - 40 U/L Final   SGOT(AST)  Date Value Ref Range Status  02/06/2014 17 15 - 37 Unit/L Final         Passed - ALT in normal range and within 360 days    ALT  Date Value Ref Range Status  01/17/2023 27 9 - 46 U/L Final   SGPT (ALT)  Date Value Ref Range Status  02/06/2014 37 12 - 78 U/L Final         Passed - Completed PHQ-2 or PHQ-9 in the last 360 days      Passed - Valid encounter within last 6 months    Recent Outpatient Visits           1 month ago Type 2 diabetes mellitus with hyperglycemia, without long-term current use of insulin [facility])   [place] [facility] Berniece Salines, FNP       Future Appointments             In 2 days Stoioff, Verna Czech, MD [facility] [place]

## 2023-02-28 ENCOUNTER — Encounter: Payer: Self-pay | Admitting: Urology

## 2023-02-28 ENCOUNTER — Ambulatory Visit: Payer: 59 | Admitting: Urology

## 2023-05-15 ENCOUNTER — Other Ambulatory Visit: Payer: Self-pay

## 2023-05-15 ENCOUNTER — Encounter: Payer: Self-pay | Admitting: Nurse Practitioner

## 2023-05-15 ENCOUNTER — Other Ambulatory Visit: Payer: Self-pay | Admitting: Nurse Practitioner

## 2023-05-15 ENCOUNTER — Ambulatory Visit: Payer: 59 | Admitting: Nurse Practitioner

## 2023-05-15 ENCOUNTER — Ambulatory Visit
Admission: RE | Admit: 2023-05-15 | Discharge: 2023-05-15 | Disposition: A | Discharge status: Home / Self Care | Payer: 59 | Source: Ambulatory Visit | Attending: Nurse Practitioner | Admitting: Nurse Practitioner

## 2023-05-15 VITALS — BP 124/72 | HR 91 | Temp 97.9°F | Resp 16 | Wt 212.5 lb

## 2023-05-15 DIAGNOSIS — R1011 Right upper quadrant pain: Secondary | ICD-10-CM

## 2023-05-15 MED ORDER — ONDANSETRON 4 MG PO TBDP
4.0000 mg | ORAL_TABLET | Freq: Three times a day (TID) | ORAL | 0 refills | Status: DC | PRN
Start: 2023-05-15 — End: 2024-04-03

## 2023-05-15 MED ORDER — OMEPRAZOLE 20 MG PO CPDR: 20.0000 mg | DELAYED_RELEASE_CAPSULE | Freq: Every day | ORAL | 3 refills | Status: DC

## 2023-05-15 MED ORDER — DICYCLOMINE HCL 20 MG PO TABS
20.0000 mg | ORAL_TABLET | Freq: Three times a day (TID) | ORAL | 1 refills | Status: DC | PRN
Start: 2023-05-15 — End: 2024-02-13

## 2023-05-15 NOTE — Progress Notes (Signed)
BP 124/72   Pulse 91   Temp 97.9 F (36.6 C) (Oral)   Resp 16   Wt 212 lb 8 oz (96.4 kg)   SpO2 99%   BMI 29.64 kg/m    Subjective:    Patient ID: Juan Gonzales, male    DOB: 1995/10/18, 27 y.o.   MRN: [id number]  HPI: GERARD SUDDITH is a 27 y.o. male  Chief Complaint  Patient presents with   Abdominal Pain    RUQ in abdomen  and  radiates  to right lower back in back for 1 week. Gas and bloated   RUQ abdominal pain Abdominal Pain:  -Duration: 2 weeks -Onset: gradual -Severity: moderate -Quality: sharp and dull -Location:  RUQ  Episode duration:  -Radiation: yes, to right lower back -Frequency: constant -Alleviating factors: heating pad -Aggravating factors: worse after eating -Status: worse -Treatments attempted: none -Fever: no -Nausea: yes -Vomiting: no -Weight loss: no -Decreased appetite: yes -Diarrhea: yes -Constipation: yes -Blood in stool: no -Heartburn: yes -Jaundice: no -Rash: no -Dysuria/urinary frequency: no -Hematuria: no -History of sexually transmitted disease: no -Recurrent NSAID use: no  - will get labs,  ultrasound  Relevant past medical, surgical, family and social history reviewed and updated as indicated. Interim medical history since our last visit reviewed. Allergies and medications reviewed and updated.  Review of Systems  Constitutional: Negative for fever or weight change.  Respiratory: Negative for cough and shortness of breath.   Cardiovascular: Negative for chest pain or palpitations.  Gastrointestinal: positive for abdominal pain, diarrhea/constipation Musculoskeletal: Negative for gait problem or joint swelling.  Skin: Negative for rash.  Neurological: Negative for dizziness or headache.  No other specific complaints in a complete review of systems (except as listed in HPI above).      Objective:    BP 124/72   Pulse 91   Temp 97.9 F (36.6 C) (Oral)   Resp 16   Wt 212 lb 8 oz (96.4 kg)   SpO2 99%    BMI 29.64 kg/m   Wt Readings from Last 3 Encounters:  05/15/23 212 lb 8 oz (96.4 kg)  01/30/23 210 lb (95.3 kg)  01/17/23 213 lb 8 oz (96.8 kg)    Physical Exam  Constitutional: Patient appears well-developed and well-nourished.  No distress.  HEENT: head atraumatic, normocephalic, pupils equal and reactive to light,, neck supple Cardiovascular: Normal rate, regular rhythm and normal heart sounds.  No murmur heard. No BLE edema. Pulmonary/Chest: Effort normal and breath sounds normal. No respiratory distress. Abdominal: Soft.  There is no tenderness. Psychiatric: Patient has a normal mood and affect. behavior is normal. Judgment and thought content normal.   Results for orders placed or performed in visit on 01/17/23  Testosterone  Result Value Ref Range   Testosterone 276 250 - 827 ng/dL  CBC with Differential/Platelet  Result Value Ref Range   WBC 6.0 3.8 - 10.8 Thousand/uL   RBC 6.32 (H) 4.20 - 5.80 Million/uL   Hemoglobin 17.2 (H) 13.2 - 17.1 g/dL   HCT 03.5 (H) 00.9 - 38.1 %   MCV 81.0 80.0 - 100.0 fL   MCH 27.2 27.0 - 33.0 pg   MCHC 33.6 32.0 - 36.0 g/dL   RDW 82.9 93.7 - 16.9 %   Platelets 240 140 - 400 Thousand/uL   MPV 9.5 7.5 - 12.5 fL   Neutro Abs 3,564 1,500 - 7,800 cells/uL   Lymphs Abs 1,806 850 - 3,900 cells/uL   Absolute Monocytes 432 200 -  950 cells/uL   Eosinophils Absolute 138 15 - 500 cells/uL   Basophils Absolute 60 0 - 200 cells/uL   Neutrophils Relative % 59.4 %   Total Lymphocyte 30.1 %   Monocytes Relative 7.2 %   Eosinophils Relative 2.3 %   Basophils Relative 1.0 %  COMPLETE METABOLIC PANEL WITH GFR  Result Value Ref Range   Glucose, Bld 101 (H) 65 - 99 mg/dL   BUN 13 7 - 25 mg/dL   Creat 1.61 0.96 - 0.45 mg/dL   eGFR 409 > OR = 60 WJ/XBJ/4.78G9   BUN/Creatinine Ratio SEE NOTE: 6 - 22 (calc)   Sodium 141 135 - 146 mmol/L   Potassium 4.3 3.5 - 5.3 mmol/L   Chloride 106 98 - 110 mmol/L   CO2 25 20 - 32 mmol/L   Calcium 9.6 8.6 - 10.3 mg/dL    Total Protein 7.8 6.1 - 8.1 g/dL   Albumin 5.0 3.6 - 5.1 g/dL   Globulin 2.8 1.9 - 3.7 g/dL (calc)   AG Ratio 1.8 1.0 - 2.5 (calc)   Total Bilirubin 0.8 0.2 - 1.2 mg/dL   Alkaline phosphatase (APISO) 63 36 - 130 U/L   AST 17 10 - 40 U/L   ALT 27 9 - 46 U/L  Hemoglobin A1c  Result Value Ref Range   Hgb A1c MFr Bld 5.4 <5.7 % of total Hgb   Mean Plasma Glucose 108 mg/dL   eAG (mmol/L) 6.0 mmol/L  Hepatitis C antibody  Result Value Ref Range   Hepatitis C Ab NON-REACTIVE NON-REACTIVE  HIV Antibody (routine testing w rflx)  Result Value Ref Range   HIV 1&2 Ab, 4th Generation NON-REACTIVE NON-REACTIVE  TSH  Result Value Ref Range   TSH 1.15 0.40 - 4.50 mIU/L  Lipid panel  Result Value Ref Range   Cholesterol 177 <200 mg/dL   HDL 32 (L) > OR = 40 mg/dL   Triglycerides 562 <130 mg/dL   LDL Cholesterol (Calc) 123 (H) mg/dL (calc)   Total CHOL/HDL Ratio 5.5 (H) <5.0 (calc)   Non-HDL Cholesterol (Calc) 145 (H) <130 mg/dL (calc)  Vitamin Q65  Result Value Ref Range   Vitamin B-12 440 200 - 1,100 pg/mL  Microalbumin / creatinine urine ratio  Result Value Ref Range   Creatinine, Urine 228 20 - 320 mg/dL   Microalb, Ur 1.6 mg/dL   Microalb Creat Ratio 7 <30 mg/g creat      Assessment & Plan:   Problem List Items Addressed This Visit   None Visit Diagnoses     RUQ abdominal pain    -  Primary   will get labs and ultrasound   Relevant Orders   CBC with Differential/Platelet   COMPLETE METABOLIC PANEL WITH GFR   Lipase   US ABDOMEN LIMITED RUQ (LIVER/GB)        Follow up plan: Return if symptoms worsen or fail to improve.

## 2023-05-16 LAB — CBC WITH DIFFERENTIAL/PLATELET
Absolute Monocytes: 392 {cells}/uL (ref 200–950)
Basophils Absolute: 53 {cells}/uL (ref 0–200)
Basophils Relative: 1 %
Eosinophils Absolute: 111 {cells}/uL (ref 15–500)
Eosinophils Relative: 2.1 %
HCT: 48.9 % (ref 38.5–50.0)
Hemoglobin: 17.1 g/dL (ref 13.2–17.1)
Lymphs Abs: 1829 {cells}/uL (ref 850–3900)
MCH: 28.1 pg (ref 27.0–33.0)
MCHC: 35 g/dL (ref 32.0–36.0)
MCV: 80.3 fL (ref 80.0–100.0)
MPV: 9.6 fL (ref 7.5–12.5)
Monocytes Relative: 7.4 %
Neutro Abs: 2915 {cells}/uL (ref 1500–7800)
Neutrophils Relative %: 55 %
Platelets: 234 10*3/uL (ref 140–400)
RBC: 6.09 10*6/uL — ABNORMAL HIGH (ref 4.20–5.80)
RDW: 12.8 % (ref 11.0–15.0)
Total Lymphocyte: 34.5 %
WBC: 5.3 10*3/uL (ref 3.8–10.8)

## 2023-05-16 LAB — COMPLETE METABOLIC PANEL WITH GFR
AG Ratio: 2.1 (calc) (ref 1.0–2.5)
ALT: 31 U/L (ref 9–46)
AST: 18 U/L (ref 10–40)
Albumin: 5.1 g/dL (ref 3.6–5.1)
Alkaline phosphatase (APISO): 66 U/L (ref 36–130)
BUN: 10 mg/dL (ref 7–25)
CO2: 25 mmol/L (ref 20–32)
Calcium: 9.6 mg/dL (ref 8.6–10.3)
Chloride: 104 mmol/L (ref 98–110)
Creat: 0.91 mg/dL (ref 0.60–1.24)
Globulin: 2.4 g/dL (ref 1.9–3.7)
Glucose, Bld: 96 mg/dL (ref 65–99)
Potassium: 4.3 mmol/L (ref 3.5–5.3)
Sodium: 138 mmol/L (ref 135–146)
Total Bilirubin: 1.1 mg/dL (ref 0.2–1.2)
Total Protein: 7.5 g/dL (ref 6.1–8.1)
eGFR: 117 mL/min/{1.73_m2} (ref >=60)

## 2023-05-16 LAB — LIPASE: Lipase: 6 U/L — ABNORMAL LOW (ref 7–60)

## 2023-05-17 ENCOUNTER — Emergency Department: Admission: EM | Admit: 2023-05-17 | Discharge: 2023-05-17 | Discharge status: Against Medical Advice | Payer: 59

## 2023-05-18 ENCOUNTER — Encounter: Payer: Self-pay | Admitting: Nurse Practitioner

## 2023-05-20 ENCOUNTER — Other Ambulatory Visit: Payer: Self-pay | Admitting: Nurse Practitioner

## 2023-05-20 DIAGNOSIS — R1011 Right upper quadrant pain: Secondary | ICD-10-CM

## 2023-05-23 ENCOUNTER — Encounter
Admission: RE | Admit: 2023-05-23 | Discharge: 2023-05-23 | Disposition: A | Discharge status: Home / Self Care | Payer: 59 | Source: Ambulatory Visit | Attending: Nurse Practitioner | Admitting: Nurse Practitioner

## 2023-05-23 DIAGNOSIS — R1011 Right upper quadrant pain: Secondary | ICD-10-CM | POA: Insufficient documentation

## 2023-05-23 MED ORDER — TECHNETIUM TC 99M MEBROFENIN IV KIT
5.2300 | PACK | Freq: Once | INTRAVENOUS | Status: AC | PRN
  Administered 2023-05-23: 5.23 via INTRAVENOUS

## 2023-05-24 ENCOUNTER — Ambulatory Visit
Admission: RE | Admit: 2023-05-24 | Discharge: 2023-05-24 | Disposition: A | Discharge status: Home / Self Care | Payer: 59 | Source: Ambulatory Visit | Attending: Nurse Practitioner | Admitting: Nurse Practitioner

## 2023-05-24 DIAGNOSIS — R1011 Right upper quadrant pain: Secondary | ICD-10-CM

## 2023-05-24 MED ORDER — IOPAMIDOL (ISOVUE-300) INJECTION 61%
100.0000 mL | Freq: Once | INTRAVENOUS | Status: AC | PRN
  Administered 2023-05-24: 100 mL via INTRAVENOUS

## 2023-05-27 ENCOUNTER — Other Ambulatory Visit: Payer: Self-pay | Admitting: Nurse Practitioner

## 2023-05-27 DIAGNOSIS — R1011 Right upper quadrant pain: Secondary | ICD-10-CM

## 2023-06-12 ENCOUNTER — Other Ambulatory Visit: Payer: Self-pay | Admitting: Nurse Practitioner

## 2023-06-12 ENCOUNTER — Encounter: Payer: Self-pay | Admitting: Nurse Practitioner

## 2023-06-12 DIAGNOSIS — R1310 Dysphagia, unspecified: Secondary | ICD-10-CM

## 2023-06-28 DIAGNOSIS — F411 Generalized anxiety disorder: Secondary | ICD-10-CM | POA: Diagnosis not present

## 2023-06-28 DIAGNOSIS — F331 Major depressive disorder, recurrent, moderate: Secondary | ICD-10-CM | POA: Diagnosis not present

## 2023-07-05 DIAGNOSIS — F331 Major depressive disorder, recurrent, moderate: Secondary | ICD-10-CM | POA: Diagnosis not present

## 2023-07-05 DIAGNOSIS — F411 Generalized anxiety disorder: Secondary | ICD-10-CM | POA: Diagnosis not present

## 2023-07-11 DIAGNOSIS — H9042 Sensorineural hearing loss, unilateral, left ear, with unrestricted hearing on the contralateral side: Secondary | ICD-10-CM | POA: Diagnosis not present

## 2023-07-11 DIAGNOSIS — J301 Allergic rhinitis due to pollen: Secondary | ICD-10-CM | POA: Diagnosis not present

## 2023-07-11 DIAGNOSIS — H9312 Tinnitus, left ear: Secondary | ICD-10-CM | POA: Diagnosis not present

## 2023-07-11 DIAGNOSIS — K219 Gastro-esophageal reflux disease without esophagitis: Secondary | ICD-10-CM | POA: Diagnosis not present

## 2023-07-12 DIAGNOSIS — F331 Major depressive disorder, recurrent, moderate: Secondary | ICD-10-CM | POA: Diagnosis not present

## 2023-07-12 DIAGNOSIS — F411 Generalized anxiety disorder: Secondary | ICD-10-CM | POA: Diagnosis not present

## 2023-07-12 DIAGNOSIS — E119 Type 2 diabetes mellitus without complications: Secondary | ICD-10-CM | POA: Diagnosis not present

## 2023-10-03 ENCOUNTER — Encounter: Payer: Self-pay | Admitting: Nurse Practitioner

## 2023-10-03 ENCOUNTER — Ambulatory Visit
Admission: RE | Admit: 2023-10-03 | Discharge: 2023-10-03 | Disposition: A | Discharge status: Home / Self Care | Payer: 59 | Attending: Nurse Practitioner | Admitting: Nurse Practitioner

## 2023-10-03 ENCOUNTER — Ambulatory Visit: Payer: 59 | Attending: Nurse Practitioner

## 2023-10-03 ENCOUNTER — Ambulatory Visit (INDEPENDENT_AMBULATORY_CARE_PROVIDER_SITE_OTHER): Payer: 59 | Admitting: Nurse Practitioner

## 2023-10-03 ENCOUNTER — Ambulatory Visit
Admission: RE | Admit: 2023-10-03 | Discharge: 2023-10-03 | Disposition: A | Discharge status: Home / Self Care | Payer: 59 | Source: Ambulatory Visit | Attending: Nurse Practitioner | Admitting: Nurse Practitioner

## 2023-10-03 VITALS — BP 116/82 | HR 78 | Temp 97.9°F | Resp 18 | Ht 71.0 in | Wt 212.4 lb

## 2023-10-03 DIAGNOSIS — Z0000 Encounter for general adult medical examination without abnormal findings: Secondary | ICD-10-CM | POA: Diagnosis not present

## 2023-10-03 DIAGNOSIS — R0602 Shortness of breath: Secondary | ICD-10-CM

## 2023-10-03 DIAGNOSIS — F411 Generalized anxiety disorder: Secondary | ICD-10-CM | POA: Diagnosis not present

## 2023-10-03 DIAGNOSIS — E782 Mixed hyperlipidemia: Secondary | ICD-10-CM

## 2023-10-03 DIAGNOSIS — Z0001 Encounter for general adult medical examination with abnormal findings: Secondary | ICD-10-CM

## 2023-10-03 DIAGNOSIS — Z130 Encounter for screening for diseases of the blood and blood-forming organs and certain disorders involving the immune mechanism: Secondary | ICD-10-CM | POA: Diagnosis not present

## 2023-10-03 DIAGNOSIS — Z131 Encounter for screening for diabetes mellitus: Secondary | ICD-10-CM

## 2023-10-03 DIAGNOSIS — R002 Palpitations: Secondary | ICD-10-CM

## 2023-10-03 DIAGNOSIS — F331 Major depressive disorder, recurrent, moderate: Secondary | ICD-10-CM | POA: Diagnosis not present

## 2023-10-03 NOTE — Progress Notes (Signed)
Name: Juan Gonzales   MRN: [id number]    DOB: [birth year]   Date:10/03/2023       Progress Note  Subjective  Chief Complaint  Chief Complaint  Patient presents with   Annual Exam    HPI  Patient presents for annual CPE and acute issue  Palpitations and exertional shortness of breath: patient reports for the last 5 months he has had episodes of shortness of breath and palpitations. He denies any chest pain.  Will get walking pulse ox, chest xray, EKG, labs and zio patch.  Discussed possible causes icluding anxiety.    Diet: well balanced diet,  however reports stress eating Exercise: has not had the time, recommend 150 min of physical activity weekly   Sleep: 7 hours Last dental exam:due, will schedule Last eye exam: 06/2023  Health Maintenance  Topic Date Due   COVID-19 Vaccine (1 - 2024-25 season) 10/19/2023*   Flu Shot  12/02/2023*   DTaP/Tdap/Td vaccine (2 - Td or Tdap) 08/01/2032   Hepatitis C Screening  Completed   HIV Screening  Completed   HPV Vaccine  Aged Out  *Topic was postponed. The date shown is not the original due date.     Depression: phq 9 is positive, he reports being very stressed,  he says that he has been taking expired xanax.  He has previously tried zoloft, prozac, buspar, lexapro.  He does not want to take an antidepressant.  He would like to see a psychiatrist.    10/03/2023    9:07 AM 05/15/2023    1:50 PM 01/17/2023    2:38 PM  Depression screen PHQ 2/9  Decreased Interest 2 2 2   Down, Depressed, Hopeless 2 3 2   PHQ - 2 Score 4 5 4   Altered sleeping 3 3 3   Tired, decreased energy 3 3 3   Change in appetite 2 0 2  Feeling bad or failure about yourself  2 1 3   Trouble concentrating 3 3 3   Moving slowly or fidgety/restless 0 0 0  Suicidal thoughts 0 0 0  PHQ-9 Score 17 15 18   Difficult doing work/chores Somewhat difficult  Somewhat difficult      10/03/2023    9:07 AM 05/15/2023    1:50 PM 01/17/2023    2:40 PM  GAD 7 : Generalized Anxiety  Score  Nervous, Anxious, on Edge 3 2 3   Control/stop worrying 3 3 3   Worry too much - different things 3 3 3   Trouble relaxing 3 2 3   Restless 1 3 0  Easily annoyed or irritable 0 1 1  Afraid - awful might happen 0 1 3  Total GAD 7 Score 13 15 16   Anxiety Difficulty Somewhat difficult  Somewhat difficult      Hypertension:  BP Readings from Last 3 Encounters:  10/03/23 116/82  05/15/23 124/72  01/30/23 133/81    Obesity: Wt Readings from Last 3 Encounters:  10/03/23 212 lb 6.4 oz (96.3 kg)  05/15/23 212 lb 8 oz (96.4 kg)  01/30/23 210 lb (95.3 kg)   BMI Readings from Last 3 Encounters:  10/03/23 29.62 kg/m  05/15/23 29.64 kg/m  01/30/23 29.29 kg/m     Lipids:  Lab Results  Component Value Date   CHOL 177 01/17/2023   CHOL 116 02/07/2014   CHOL 140 02/06/2014   Lab Results  Component Value Date   HDL 32 (L) 01/17/2023   HDL 22 (L) 02/07/2014   HDL 27 (L) 02/06/2014   Lab Results  Component  Value Date   LDLCALC 123 (H) 01/17/2023   LDLCALC 76 02/07/2014   LDLCALC 96 02/06/2014   Lab Results  Component Value Date   TRIG 111 01/17/2023   TRIG 89 02/07/2014   TRIG 83 02/06/2014   Lab Results  Component Value Date   CHOLHDL 5.5 (H) 01/17/2023   No results found for: "LDLDIRECT" Glucose:  Glucose  Date Value Ref Range Status  02/07/2014 68 65 - 99 mg/dL Final  16/06/9603 91 65 - 99 mg/dL Final   Glucose, Bld  Date Value Ref Range Status  05/15/2023 96 65 - 99 mg/dL Final    Comment:    .            Fasting reference interval .   01/17/2023 101 (H) 65 - 99 mg/dL Final    Comment:    .            Fasting reference interval . For someone without known diabetes, a glucose value between 100 and 125 mg/dL is consistent with prediabetes and should be confirmed with a follow-up test. .     Flowsheet Row Office Visit from 05/15/2023 in [facility]  AUDIT-C Score 1        Married STD testing and prevention  (HIV/chl/gon/syphilis): completed Hep C: completed  Skin cancer: Discussed monitoring for atypical lesions Colorectal cancer: does not qualify Prostate cancer: does not qualify No results found for: "PSA"   Lung cancer:   Low Dose CT Chest recommended if Age 50-80 years, 30 pack-year currently smoking OR have quit w/in 15years. Patient does not qualify.   AAA:  The USPSTF recommends one-time screening with ultrasonography in men ages 61 to 42 years who have ever smoked ECG:  performed today,    Vaccines:  HPV: up to at age 36 , ask insurance if age between 60-45  Shingrix: 68-64 yo and ask insurance if covered when patient above 82 yo Pneumonia:  educated and discussed with patient. Flu:  educated and discussed with patient.  Advanced Care Planning: A voluntary discussion about advance care planning including the explanation and discussion of advance directives.  Discussed health care proxy and Living will, and the patient was able to identify a health care proxy as wife.  Patient does not have a living will at present time. If patient does have living will, I have requested they bring this to the clinic to be scanned in to their chart.  Patient Active Problem List   Diagnosis Date Noted   Mixed hyperlipidemia 10/03/2023   GAD (generalized anxiety disorder) 01/17/2023   Moderate episode of recurrent major depressive disorder (HCC) 01/17/2023   Irritable bowel syndrome (IBS) 07/09/2011    Past Surgical History:  Procedure Laterality Date   ESOPHAGOGASTRODUODENOSCOPY (EGD) WITH PROPOFOL N/A 01/30/2023   Procedure: ESOPHAGOGASTRODUODENOSCOPY (EGD) WITH PROPOFOL;  Surgeon: Toledo, Boykin Nearing, MD;  Location: [facility] ENDOSCOPY;  Service: Gastroenterology;  Laterality: N/A;   TONSILLECTOMY     WISDOM TOOTH EXTRACTION      Family History  Problem Relation Age of Onset   Hypertension Father     Social History   Socioeconomic History   Marital status: Married    Spouse name: Not on  file   Number of children: Not on file   Years of education: Not on file   Highest education level: Not on file  Occupational History   Not on file  Tobacco Use   Smoking status: Never    Passive exposure: Never  Smokeless tobacco: Never  Vaping Use   Vaping status: Every Day   Substances: Nicotine, Flavoring  Substance and Sexual Activity   Alcohol use: Yes    Comment: rarely   Drug use: Yes    Types: Marijuana    Comment: 2-3 times a week   Sexual activity: Yes  Other Topics Concern   Not on file  Social History Narrative   Not on file   Social Drivers of Health   Financial Resource Strain: Medium Risk (10/03/2023)   Overall Financial Resource Strain (CARDIA)    Difficulty of Paying Living Expenses: Somewhat hard  Food Insecurity: No Food Insecurity (10/03/2023)   Hunger Vital Sign    Worried About Running Out of Food in the Last Year: Never true    Ran Out of Food in the Last Year: Never true  Transportation Needs: No Transportation Needs (10/03/2023)   PRAPARE - Administrator, Civil Service (Medical): No    Lack of Transportation (Non-Medical): No  Physical Activity: Inactive (10/03/2023)   Exercise Vital Sign    Days of Exercise per Week: 0 days    Minutes of Exercise per Session: 0 min  Stress: Stress Concern Present (10/03/2023)   [employer] of Occupational Health - Occupational Stress Questionnaire    Feeling of Stress : Very much  Social Connections: Unknown (10/03/2023)   Social Connection and Isolation Panel [NHANES]    Frequency of Communication with Friends and Family: More than three times a week    Frequency of Social Gatherings with Friends and Family: More than three times a week    Attends Religious Services: Patient declined    Database administrator or Organizations: Yes    Attends Engineer, structural: More than 4 times per year    Marital Status: Married  Catering manager Violence: Not At Risk (10/03/2023)    Humiliation, Afraid, Rape, and Kick questionnaire    Fear of Current or Ex-Partner: No    Emotionally Abused: No    Physically Abused: No    Sexually Abused: No     Current Outpatient Medications:    ALPRAZolam (NIRAVAM) 0.25 MG dissolvable tablet, Take 0.25 mg by mouth at bedtime as needed for anxiety., Disp: , Rfl:    dicyclomine (BENTYL) 20 MG tablet, Take 1 tablet (20 mg total) by mouth 3 (three) times daily as needed for spasms (abd cramping)., Disp: 20 tablet, Rfl: 1   omeprazole (PRILOSEC) 20 MG capsule, Take 1 capsule (20 mg total) by mouth daily., Disp: 30 capsule, Rfl: 3   ondansetron (ZOFRAN-ODT) 4 MG disintegrating tablet, Take 1 tablet (4 mg total) by mouth every 8 (eight) hours as needed for nausea or vomiting., Disp: 20 tablet, Rfl: 0  Allergies  Allergen Reactions   Octacosanol    Octacosanol    Hydrocodone Rash     ROS  Constitutional: Negative for fever or weight change.  Respiratory: Negative for cough and positive for shortness of breath.   Cardiovascular: Negative for chest pain, positive for  palpitations.  Gastrointestinal: Negative for abdominal pain, no bowel changes.  Musculoskeletal: Negative for gait problem or joint swelling.  Skin: Negative for rash.  Neurological: Negative for dizziness or headache.  No other specific complaints in a complete review of systems (except as listed in HPI above).    Objective  Vitals:   10/03/23 0901  BP: 116/82  Pulse: 78  Resp: 18  Temp: 97.9 F (36.6 C)  SpO2: 98%  Weight: 212 lb  6.4 oz (96.3 kg)  Height: 5\' 11"  (1.803 m)    Body mass index is 29.62 kg/m.  Physical Exam Vitals reviewed.  Constitutional:      Appearance: Normal appearance.  HENT:     Head: Normocephalic.     Right Ear: Tympanic membrane normal.     Left Ear: Tympanic membrane normal.     Nose: Nose normal.  Eyes:     Extraocular Movements: Extraocular movements intact.     Conjunctiva/sclera: Conjunctivae normal.     Pupils:  Pupils are equal, round, and reactive to light.  Neck:     Thyroid: No thyroid mass, thyromegaly or thyroid tenderness.  Cardiovascular:     Rate and Rhythm: Normal rate and regular rhythm.     Pulses: Normal pulses.     Heart sounds: Normal heart sounds.  Pulmonary:     Effort: Pulmonary effort is normal.     Breath sounds: Normal breath sounds.  Abdominal:     General: Bowel sounds are normal.     Palpations: Abdomen is soft.  Musculoskeletal:        General: Normal range of motion.     Cervical back: Normal range of motion and neck supple.     Right lower leg: No edema.     Left lower leg: No edema.  Skin:    General: Skin is warm and dry.     Capillary Refill: Capillary refill takes less than 2 seconds.  Neurological:     General: No focal deficit present.     Mental Status: He is alert and oriented to person, place, and time. Mental status is at baseline.  Psychiatric:        Mood and Affect: Mood normal.        Behavior: Behavior normal.        Thought Content: Thought content normal.        Judgment: Judgment normal.    EKG: normal EKG, normal sinus rhythm.   No results found for this or any previous visit (from the past 2160 hours).   Fall Risk:    10/03/2023    9:02 AM 05/15/2023    1:49 PM 01/17/2023    2:38 PM  Fall Risk   Falls in the past year? 0 0 0  Number falls in past yr: 0 0 0  Injury with Fall? 0 0 0  Risk for fall due to :  No Fall Risks   Follow up Falls evaluation completed Falls prevention discussed       Functional Status Survey: Is the patient deaf or have difficulty hearing?: No Does the patient have difficulty seeing, even when wearing glasses/contacts?: No Does the patient have difficulty concentrating, remembering, or making decisions?: No Does the patient have difficulty walking or climbing stairs?: No Does the patient have difficulty dressing or bathing?: No Does the patient have difficulty doing errands alone such as visiting a  doctor's office or shopping?: No    Assessment & Plan  1. Annual physical exam (Primary) -increase physical activity - CBC with Differential/Platelet - COMPLETE METABOLIC PANEL WITH GFR - Lipid panel - Hemoglobin A1c  2. Mixed hyperlipidemia  - Lipid panel  3. Screening for diabetes mellitus  - COMPLETE METABOLIC PANEL WITH GFR - Hemoglobin A1c  4. Screening for deficiency anemia  - CBC with Differential/Platelet  5. Moderate episode of recurrent major depressive disorder (HCC) -declined medication would like to go to psychiatry - Ambulatory referral to Psychiatry  6. GAD (generalized anxiety  disorder)  - Ambulatory referral to Psychiatry  7. Exertional shortness of breath - walking pulse ox completed, oxygen and pulse stayed in normal range.  - LONG TERM MONITOR (3-14 DAYS); Future - DG Chest 2 View; Future - EKG 12-Lead  8. Palpitations -EKG nsr - LONG TERM MONITOR (3-14 DAYS); Future - TSH - EKG 12-Lead    -Prostate cancer screening and PSA options (with potential risks and benefits of testing vs not testing) were discussed along with recent recs/guidelines. -USPSTF grade A and B recommendations reviewed with patient; age-appropriate recommendations, preventive care, screening tests, etc discussed and encouraged; healthy living encouraged; see AVS for patient education given to patient -Discussed importance of 150 minutes of physical activity weekly, eat two servings of fish weekly, eat one serving of tree nuts ( cashews, pistachios, pecans, almonds.Marland Kitchen) every other day, eat 6 servings of fruit/vegetables daily and drink plenty of water and avoid sweet beverages.  -Reviewed Health Maintenance: yes

## 2023-10-04 ENCOUNTER — Encounter: Payer: Self-pay | Admitting: Nurse Practitioner

## 2023-10-04 LAB — COMPLETE METABOLIC PANEL WITH GFR
AG Ratio: 2.2 (calc) (ref 1.0–2.5)
ALT: 33 U/L (ref 9–46)
AST: 18 U/L (ref 10–40)
Albumin: 4.9 g/dL (ref 3.6–5.1)
Alkaline phosphatase (APISO): 59 U/L (ref 36–130)
BUN: 11 mg/dL (ref 7–25)
CO2: 27 mmol/L (ref 20–32)
Calcium: 9.5 mg/dL (ref 8.6–10.3)
Chloride: 106 mmol/L (ref 98–110)
Creat: 1 mg/dL (ref 0.60–1.24)
Globulin: 2.2 g/dL (ref 1.9–3.7)
Glucose, Bld: 111 mg/dL — ABNORMAL HIGH (ref 65–99)
Potassium: 4.4 mmol/L (ref 3.5–5.3)
Sodium: 140 mmol/L (ref 135–146)
Total Bilirubin: 0.6 mg/dL (ref 0.2–1.2)
Total Protein: 7.1 g/dL (ref 6.1–8.1)
eGFR: 104 mL/min/{1.73_m2} (ref >=60)

## 2023-10-04 LAB — CBC WITH DIFFERENTIAL/PLATELET
Absolute Lymphocytes: 1750 {cells}/uL (ref 850–3900)
Absolute Monocytes: 422 {cells}/uL (ref 200–950)
Basophils Absolute: 51 {cells}/uL (ref 0–200)
Basophils Relative: 0.9 %
Eosinophils Absolute: 131 {cells}/uL (ref 15–500)
Eosinophils Relative: 2.3 %
HCT: 49.3 % (ref 38.5–50.0)
Hemoglobin: 16.7 g/dL (ref 13.2–17.1)
MCH: 27.8 pg (ref 27.0–33.0)
MCHC: 33.9 g/dL (ref 32.0–36.0)
MCV: 82 fL (ref 80.0–100.0)
MPV: 9.6 fL (ref 7.5–12.5)
Monocytes Relative: 7.4 %
Neutro Abs: 3346 {cells}/uL (ref 1500–7800)
Neutrophils Relative %: 58.7 %
Platelets: 224 10*3/uL (ref 140–400)
RBC: 6.01 10*6/uL — ABNORMAL HIGH (ref 4.20–5.80)
RDW: 12.4 % (ref 11.0–15.0)
Total Lymphocyte: 30.7 %
WBC: 5.7 10*3/uL (ref 3.8–10.8)

## 2023-10-04 LAB — HEMOGLOBIN A1C
Hgb A1c MFr Bld: 5.1 %{Hb} (ref <5.7)
Mean Plasma Glucose: 100 mg/dL
eAG (mmol/L): 5.5 mmol/L

## 2023-10-04 LAB — LIPID PANEL
Cholesterol: 163 mg/dL (ref <200)
HDL: 33 mg/dL — ABNORMAL LOW (ref >=40)
LDL Cholesterol (Calc): 109 mg/dL — ABNORMAL HIGH
Non-HDL Cholesterol (Calc): 130 mg/dL — ABNORMAL HIGH (ref <130)
Total CHOL/HDL Ratio: 4.9 (calc) (ref <5.0)
Triglycerides: 104 mg/dL (ref <150)

## 2023-10-04 LAB — TSH: TSH: 1.21 m[IU]/L (ref 0.40–4.50)

## 2023-10-25 ENCOUNTER — Encounter: Payer: Self-pay | Admitting: Nurse Practitioner

## 2023-10-25 ENCOUNTER — Other Ambulatory Visit: Payer: Self-pay | Admitting: Nurse Practitioner

## 2023-10-25 DIAGNOSIS — F411 Generalized anxiety disorder: Secondary | ICD-10-CM

## 2023-10-25 MED ORDER — ALPRAZOLAM 0.25 MG PO TBDP: 0.2500 mg | ORAL_TABLET | Freq: Every evening | ORAL | 0 refills | Status: DC | PRN

## 2023-12-16 ENCOUNTER — Encounter: Payer: Self-pay | Admitting: Nurse Practitioner

## 2024-02-13 ENCOUNTER — Ambulatory Visit (INDEPENDENT_AMBULATORY_CARE_PROVIDER_SITE_OTHER): Payer: Self-pay | Admitting: Psychiatry

## 2024-02-13 ENCOUNTER — Telehealth: Payer: Self-pay | Admitting: Psychiatry

## 2024-02-13 ENCOUNTER — Encounter: Payer: Self-pay | Admitting: Psychiatry

## 2024-02-13 VITALS — BP 118/78 | HR 79 | Temp 98.1°F | Ht 71.0 in | Wt 220.6 lb

## 2024-02-13 DIAGNOSIS — R41840 Attention and concentration deficit: Secondary | ICD-10-CM

## 2024-02-13 DIAGNOSIS — F411 Generalized anxiety disorder: Secondary | ICD-10-CM | POA: Diagnosis not present

## 2024-02-13 DIAGNOSIS — F1290 Cannabis use, unspecified, uncomplicated: Secondary | ICD-10-CM | POA: Diagnosis not present

## 2024-02-13 MED ORDER — ESCITALOPRAM OXALATE 5 MG PO TABS: 5.0000 mg | ORAL_TABLET | Freq: Every day | ORAL | 0 refills | Status: DC

## 2024-02-13 NOTE — Progress Notes (Signed)
 Psychiatric Initial Adult Assessment   Patient Identification: Juan Gonzales MRN:  [id number] Date of Evaluation:  02/13/2024 Referral Source: Donny Gall FNP Chief Complaint:   Chief Complaint  Patient presents with   Establish Care   Anxiety   Insomnia   Attention and concentration deficit   Visit Diagnosis:    ICD-10-CM   1. GAD (generalized anxiety disorder)  F41.1 escitalopram  (LEXAPRO ) 5 MG tablet    2. Attention and concentration deficit  R41.840 Ambulatory referral to Neuropsychology    3. Long term current use of cannabis  F12.90       History of Present Illness:  Juan Gonzales is a 28 year old Caucasian male, married, employed, lives in [place], has a history of anxiety, sleep problems, attention and focus problems, diabetes mellitus type 2, IBS, was evaluated in office today presented to establish care.  He experiences significant difficulty concentrating and completing simple tasks due to persistent 'what if' thoughts that lead to near panic attacks. He is preoccupied with worst-case scenarios, such as concerns about being a good father and fears related to driving. He also has anxiety, particularly related to his diabetes, fearing severe outcomes like diabetic ketoacidosis. He describes himself as a 'hypochondriac', with anxiety exacerbated by physical symptoms such as chest pain or numbness, which he attributes to his health conditions.  His anxiety symptoms has been going on since the past 6 years or more and getting worse.  He has a history of panic attacks that occur unexpectedly, often triggered by stress or specific situations like attempting to drive or trying to sleep. These attacks last 30-45 minutes and include symptoms such as heart palpitations, lightheadedness, sweating, and a sensation of 'legs are jello'. He experiences about one panic attack per week.  He tries to focus on his breath and other coping strategies and when that does not work he takes his  Xanax , quarter of a pill prescribed by his primary care provider.  30 pills last him for 6 months.  He does not use it much.  As noted above he does struggle with his attention and focus.  Has always struggled with it and he does not know how he got through school.  He however was able to get a GED and later on was able to get an associate degree in cyber security.  Although he completed his associate degree couple of years ago he struggles with significant motivation problems to pursue a job in that field.  He struggles with procrastination.  He lacks organizational skills.  He reports ADHD runs in his family.  His brother has ADHD and autism spectrum.  His mother may also have it.  He was never officially tested as a child and is interested in referral for testing.  He has a history of depression but currently denies feeling depressed, though he experiences sadness and a lack of motivation. He has a history of IBS, which exacerbates his anxiety when symptoms flare up. He also reports sleep disturbances, feeling exhausted despite sleeping 6-7 hours, and experiencing panic attacks upon waking.  He denies any snoring or apneic episodes.  He denies any manic or hypomanic symptoms.  He denies any significant obsessions or compulsive behaviors.  He denies any suicidality, homicidality or perceptual disturbances.  He has tried various medications for anxiety and depression, including Zoloft , Prozac, and Buspar, but discontinued them due to adverse effects such as feeling like a 'zombie' or increased anxiety.    Associated Signs/Symptoms: Depression Symptoms:  difficulty concentrating, anxiety,  panic attacks, Appetite varies , does have IBS (Hypo) Manic Symptoms:  Denies Anxiety Symptoms:  Excessive Worry, Panic Symptoms, Psychotic Symptoms:  Denies PTSD Symptoms: denies  Past Psychiatric History: He denies inpatient behavioral health admissions.  He denies suicide attempts.  He reports he may  have seen a psychiatrist at [facility] several years ago however only had 1 visit and never went back.  He used to see a therapist in [place] beginning of 2025 for a few sessions.  Did not continue it since he did not feel it worked.  Most recently his anxiety was being managed by his primary care provider who was prescribing him Xanax .  Previous Psychotropic Medications: Yes Zoloft -side effects, Prozac-did not work, BuSpar-took it only for a day and felt he had side effects.  Substance Abuse History in the last 12 months: Uses cannabis daily has been using since the age of 34. Vapes it 3-4 times a day.  Consequences of Substance Abuse: Unknown if anxiety/sleep and concentration problems due to use of cannabis.  Past Medical History:  Past Medical History:  Diagnosis Date   Abdominal pain    Anxiety    Diabetes mellitus without complication (HCC)    type 2   Diarrhea    IBS (irritable bowel syndrome)    presumptive    Past Surgical History:  Procedure Laterality Date   ESOPHAGOGASTRODUODENOSCOPY (EGD) WITH PROPOFOL  N/A 01/30/2023   Procedure: ESOPHAGOGASTRODUODENOSCOPY (EGD) WITH PROPOFOL ;  Surgeon: Toledo, Alphonsus Jeans, MD;  Location: [facility] ENDOSCOPY;  Service: Gastroenterology;  Laterality: N/A;   TONSILLECTOMY     WISDOM TOOTH EXTRACTION      Family Psychiatric History: As noted below.  Family History:  Family History  Problem Relation Age of Onset   Hypertension Father    ADD / ADHD Brother    Autism spectrum disorder Brother    Suicidality Maternal Aunt     Social History:   Social History   Socioeconomic History   Marital status: Married    Spouse name: Not on file   Number of children: Not on file   Years of education: Not on file   Highest education level: Associate degree: occupational, Scientist, product/process development, or vocational program  Occupational History   Occupation: Production designer, theatre/television/film    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  Tobacco Use   Smoking status: Never    Passive exposure: Never   Smokeless  tobacco: Never  Vaping Use   Vaping status: Every Day   Substances: Nicotine, THC, Flavoring  Substance and Sexual Activity   Alcohol use: Yes    Comment: rarely   Drug use: Yes    Types: Marijuana    Comment: 2-3 times a week   Sexual activity: Yes  Other Topics Concern   Not on file  Social History Narrative   Not on file   Social Drivers of Health   Financial Resource Strain: Medium Risk (10/03/2023)   Overall Financial Resource Strain (CARDIA)    Difficulty of Paying Living Expenses: Somewhat hard  Food Insecurity: No Food Insecurity (10/03/2023)   Hunger Vital Sign    Worried About Running Out of Food in the Last Year: Never true    Ran Out of Food in the Last Year: Never true  Transportation Needs: No Transportation Needs (10/03/2023)   PRAPARE - Administrator, Civil Service (Medical): No    Lack of Transportation (Non-Medical): No  Physical Activity: Inactive (10/03/2023)   Exercise Vital Sign    Days of Exercise per Week: 0 days  Minutes of Exercise per Session: 0 min  Stress: Stress Concern Present (10/03/2023)   [employer] of Occupational Health - Occupational Stress Questionnaire    Feeling of Stress : Very much  Social Connections: Unknown (10/03/2023)   Social Connection and Isolation Panel    Frequency of Communication with Friends and Family: More than three times a week    Frequency of Social Gatherings with Friends and Family: More than three times a week    Attends Religious Services: Patient declined    Database administrator or Organizations: Yes    Attends Engineer, structural: More than 4 times per year    Marital Status: Married    Additional Social History: Patient was born in [place].  He was raised by both parents initially and later on his parents divorced when he was around 89 thereafter primarily raised by his mother.  He has 2 brothers.  Reports he had a good childhood.  He completed GED and has an associate degree  in cyber security.  He works as a Production designer, theatre/television/film at [employer].  He is married.  They are expecting their first child in 3 weeks.  He denies legal problems.  Denies access to a gun.  Denies being in the [employer].  Reports he believes in a higher power although he does not believe in organized religion.  Allergies:   Allergies  Allergen Reactions   Octacosanol    Octacosanol    Hydrocodone Rash    Metabolic Disorder Labs: Lab Results  Component Value Date   HGBA1C 5.1 10/03/2023   MPG 100 10/03/2023   MPG 108 01/17/2023   No results found for: PROLACTIN Lab Results  Component Value Date   CHOL 163 10/03/2023   TRIG 104 10/03/2023   HDL 33 (L) 10/03/2023   CHOLHDL 4.9 10/03/2023   VLDL 18 02/07/2014   LDLCALC 109 (H) 10/03/2023   LDLCALC 123 (H) 01/17/2023   Lab Results  Component Value Date   TSH 1.21 10/03/2023    Therapeutic Level Labs: No results found for: LITHIUM No results found for: CBMZ No results found for: VALPROATE  Current Medications: Current Outpatient Medications  Medication Sig Dispense Refill   ALPRAZolam  (NIRAVAM ) 0.25 MG dissolvable tablet Take 1 tablet (0.25 mg total) by mouth at bedtime as needed for anxiety. 30 tablet 0   escitalopram  (LEXAPRO ) 5 MG tablet Take 1 tablet (5 mg total) by mouth daily with breakfast. 30 tablet 0   ondansetron  (ZOFRAN -ODT) 4 MG disintegrating tablet Take 1 tablet (4 mg total) by mouth every 8 (eight) hours as needed for nausea or vomiting. 20 tablet 0   No current facility-administered medications for this visit.    Musculoskeletal: Strength & Muscle Tone: within normal limits Gait & Station: normal Patient leans: N/A  Psychiatric Specialty Exam: Review of Systems  Psychiatric/Behavioral:  Positive for decreased concentration and sleep disturbance. The patient is nervous/anxious.     Blood pressure 118/78, pulse 79, temperature 98.1 F (36.7 C), temperature source Temporal, height 5' 11 (1.803 m), weight  220 lb 9.6 oz (100.1 kg), SpO2 98%.Body mass index is 30.77 kg/m.  General Appearance: Fairly Groomed  Eye Contact:  Fair  Speech:  Clear and Coherent  Volume:  Normal  Mood:  Anxious  Affect:  Congruent  Thought Process:  Goal Directed and Descriptions of Associations: Intact  Orientation:  Full (Time, Place, and Person)  Thought Content:  Logical  Suicidal Thoughts:  No  Homicidal Thoughts:  No  Memory:  Immediate;   Fair Recent;   Fair Remote;   Fair  Judgement:  Fair  Insight:  Fair  Psychomotor Activity:  Normal  Concentration:  Concentration: Fair and Attention Span: Fair  Recall:  [employer] of Knowledge:Fair  Language: Fair  Akathisia:  No  Handed:  Right  AIMS (if indicated):  not done  Assets:  Communication Skills Desire for Improvement Housing Social Support Transportation  ADL's:  Intact  Cognition: WNL  Sleep:  Poor   Screenings: GAD-7    Flowsheet Row Office Visit from 02/13/2024 in [facility] Office Visit from 10/03/2023 in [facility] Office Visit from 05/15/2023 in [facility] Office Visit from 01/17/2023 in [facility]  Total GAD-7 Score 17 13 15 16    PHQ2-9    Flowsheet Row Office Visit from 02/13/2024 in [facility] Office Visit from 10/03/2023 in [facility] Office Visit from 05/15/2023 in [facility] Office Visit from 01/17/2023 in [facility]  PHQ-2 Total Score 3 4 5 4   PHQ-9 Total Score 16 17 15 18    Flowsheet Row Office Visit from 02/13/2024 in [facility] Admission (Discharged) from 01/30/2023 in [facility]  C-SSRS RISK CATEGORY No Risk No Risk    Assessment and Plan: Juan Gonzales is a 28 year old Caucasian male who has a history of  anxiety, attention and focus deficit, was evaluated in office today, discussed assessment and plan as noted below.   Generalized anxiety disorder-unstable Juan Gonzales presents with symptoms of generalized anxiety disorder, including constant worry, intrusive thoughts, and panic attacks. He experiences anxiety related to various scenarios, including health concerns and potential future events. Previous trials with SSRIs like Zoloft  and Prozac were not well-tolerated due to side effects such as feeling sedated and increased anxiety. He has used Xanax  for acute anxiety relief.Discussed the potential benefits and side effects of SSRIs, SNRIs, and other medication options, emphasizing the need for a gradual dosage increase and the importance of cognitive behavioral therapy. The impact of cannabis use on anxiety and attention issues was also addressed. The risks of tolerance with Xanax  and the policy against prescribing controlled substances with concurrent cannabis use were discussed. Alternative medications for acute anxiety, such as hydroxyzine or propranolol, were suggested. - Start Lexapro  5 mg daily with breakfast. - Refer to psychotherapy for cognitive behavioral therapy. - Provide information on cannabis use and its effects on anxiety. - Discuss alternative medications for acute anxiety, such as hydroxyzine or propranolol, if needed.  Attention and concentration deficit-rule out attention deficit hyperactivity disorder (ADHD)-unstable Juan Gonzales exhibits symptoms suggestive of ADHD, including difficulty focusing and completing tasks. He has a family history of ADHD and has never been formally diagnosed. Cannabis use may also contribute to attention issues. Formal ADHD testing is recommended to confirm the diagnosis. - Refer for formal ADHD testing with a neuropsychologist.  Long-term use of cannabis-rule out cannabis use disorder Currently using cannabis daily.  Provided counseling. - Will reevaluate in  future sessions.  I have reviewed labs including TSH dated 10/03/2023-within normal limits.  Review notes per primary care provider Ms. Donny Gall dated 10/03/2023-patient with generalized anxiety disorder.  Collaboration of Care: Referral or follow-up with counselor/therapist AEB patient agrees to establish care with therapist, provided resources.  Patient/Guardian was advised Release of Information must be obtained prior to any  record release in order to collaborate their care with an outside provider. Patient/Guardian was advised if they have not already done so to contact the registration department to sign all necessary forms in order for us  to release information regarding their care.   Consent: Patient/Guardian gives verbal consent for treatment and assignment of benefits for services provided during this visit. Patient/Guardian expressed understanding and agreed to proceed.  This note was generated in part or whole with voice recognition software. Voice recognition is usually quite accurate but there are transcription errors that can and very often do occur. I apologize for any typographical errors that were not detected and corrected.    Deliah Strehlow, MD 6/13/202510:51 AM

## 2024-02-13 NOTE — Telephone Encounter (Signed)
 This message is empty

## 2024-02-13 NOTE — Patient Instructions (Addendum)
 [email or web address]  [email or web address]  [email or web address] [facility] [address], [place], [place] [address]   [phone]  [facility] [email or web address] [address] [place], [place] [address]  [phone]  [facility] [email or web address] [address] [place], [place] [address]  [phone]   [facility] - [phone]  [facility] - [phone]  [facility] - [phone] counseling [phone]  [facility] - [phone]    [facility] has interns who offer sliding scale rates and some of the full time clinicians do, as well. You complete their contact form on their website and the referrals coordinator will help to get connected to someone   [email or web address] [phone]     Cannabis Use Disorder Cannabis use disorder occurs when marijuana use disrupts a person's daily life or causes health problems. This condition can be dangerous. The health problems this condition can cause include: Long-lasting problems with thinking and learning. These can be permanent in young people. Mental health problems, such as anxiety disorders, paranoia, psychosis, or schizophrenia. Dangerously high blood pressure and heart rate. Breathing problems and illness, such as bronchitis, emphysema, or lung cancer. Problems with fetal development during pregnancy and child development after pregnancy. People with this condition are also more likely to use other drugs. What are the causes? This condition is caused by using marijuana too much over time. It is not caused by using it only once in a while. Many people with this condition use marijuana because it gives them a feeling of extreme pleasure or relaxation. What increases the risk? The following factors may make a person more likely  to develop this condition: Being male. Having a family history of cannabis use disorder. Having mental health issues such as depression or post-traumatic stress disorder (PTSD). What are the signs or symptoms? Symptoms of this condition include: Addiction Using marijuana in greater amounts or for longer periods of time than you want to. Craving marijuana. Spending a lot of time getting marijuana, using it, or recovering from its effects. Having problems at work, at school, at home, or in relationships because of marijuana use. Giving up or cutting down on important life activities because of marijuana use. Using marijuana at times when it is dangerous, such as while you are driving a car. Needing more and more marijuana to get the same desired effect (building up a tolerance). Lack of motivation, known as amotivational syndrome, which leads to poor school and work performance. Physical problems A long-lasting cough. Long-term lung problems and difficulty breathing. Mental problems Hallucinations. Severe anxiety. Trouble sleeping. Increase in violent behavior in young people. Withdrawal problems You may have symptoms when you stop using marijuana. Symptoms include: Irritability or anger. Anxiety or restlessness. Trouble sleeping. Loss of appetite or weight loss. Aches and pains. Shakiness, sweating, or chills. How is this diagnosed? This condition is diagnosed with an assessment. Your health care provider will ask about your marijuana use and how it affects your life. You will be diagnosed with the condition if you have had at least two symptoms of this condition within a 78-month period. How severe the condition is depends on how many symptoms you have. If you have two to three symptoms, your condition is mild. If you have four to five symptoms, your condition is moderate. If you have six or more symptoms, your condition is severe. A physical exam  or lab tests may be done to see  if you have physical problems resulting from marijuana use. Your health care provider may also screen for drug use and refer you to a mental health professional for evaluation. How is this treated? Treatment for this condition is usually provided by mental health professionals with training in substance use disorders. Your treatment may involve: Counseling. This treatment is also called talk therapy. It is provided by substance use treatment counselors. A counselor can address the reasons you use marijuana and suggest ways to keep you from using it again. The goals of talk therapy are to: Find healthy activities to replace using marijuana. Identify and avoid the things that trigger your marijuana use. Help you learn how to handle cravings. Support groups. Support groups are led by people who have quit using marijuana. They provide emotional support, advice, and guidance. Medicine. Medicine is used to treat mental health issues that trigger marijuana use or that result from it. Follow these instructions at home: Lifestyle Make healthy lifestyle choices, such as: Eating a healthy diet. Getting enough exercise. Learning skills for managing stress.  General instructions Take over-the-counter and prescription medicines, as well as any herbal remedies, only as told by your health care provider. Check with your health care provider before starting any new medicines. Work with Photographer or group to develop tools to keep you from using marijuana again (relapsing). Learn daily living skills and work Programmer, applications. Where to find more information Centers for Disease Control and Prevention (CDC): TonerPromos.no Substance Abuse and Mental Health Services Administration: RockToxic.pl Contact a health care provider if: You are not able to take your medicines as told. Your symptoms get worse. Get help right away if: You have serious thoughts about hurting yourself or others. Get help right away if you feel like you  may hurt yourself or others, or have thoughts about taking your own life. Go to your nearest emergency room or: Call 911. Call the National Suicide Prevention Lifeline at 704-578-2868 or 988. This is open 24 hours a day. Text the Crisis Text Line at 469-804-9868. This information is not intended to replace advice given to you by your health care provider. Make sure you discuss any questions you have with your health care provider. Document Revised: 01/24/2022 Document Reviewed: 01/24/2022 Elsevier Patient Education  2024 Elsevier Inc.  Escitalopram Tablets What is this medication? ESCITALOPRAM (es sye TAL oh pram) treats depression and anxiety. It increases the amount of serotonin in the brain, a hormone that helps regulate mood. It belongs to a group of medications called SSRIs. This medicine may be used for other purposes; ask your health care provider or pharmacist if you have questions. COMMON BRAND NAME(S): Lexapro What should I tell my care team before I take this medication? They need to know if you have any of these conditions: Bipolar disorder or a family history of bipolar disorder Diabetes Glaucoma Heart disease Kidney disease Liver disease Receiving electroconvulsive therapy Seizures Suicidal thoughts, plans, or attempt by you or a family member An unusual or allergic reaction to escitalopram, other medications, foods, dyes, or preservatives Pregnant or trying to become pregnant Breastfeeding How should I use this medication? Take this medication by mouth with a glass of water. Take it as directed on the prescription label at the same time every day. You can take it with or without food. If it upsets your stomach, take it with food. Do not take it more often than directed. Do not stop taking this  medication suddenly except upon the advice of your care team. Stopping this medication too quickly may cause serious side effects or your condition may worsen. A special MedGuide will  be given to you by the pharmacist with each prescription and refill. Be sure to read this information carefully each time. Talk to your care team about the use of this medication in children. Special care may be needed. Overdosage: If you think you have taken too much of this medicine contact a poison control center or emergency room at once. NOTE: This medicine is only for you. Do not share this medicine with others. What if I miss a dose? If you miss a dose, take it as soon as you can. If it is almost time for your next dose, take only that dose. Do not take double or extra doses. What may interact with this medication? Do not take this medication with any of the following: Certain medications for fungal infections, such as fluconazole, itraconazole, ketoconazole, posaconazole, voriconazole Cisapride Citalopram Dronedarone Linezolid MAOIs, such as Carbex, Eldepryl, Marplan, Nardil, and Parnate Methylene blue (injected into a vein) Pimozide Thioridazine This medication may also interact with the following: Alcohol Amphetamines Aspirin and aspirin-like medications Carbamazepine Certain medications for mental health conditions Certain medications for migraine headache, such as almotriptan, eletriptan, frovatriptan, naratriptan, rizatriptan, sumatriptan, zolmitriptan Certain medications for sleep Certain medications that treat or prevent blood clots, such as warfarin, enoxaparin, dalteparin Cimetidine Diuretics Dofetilide Fentanyl Furazolidone Isoniazid Lithium Metoprolol NSAIDs, medications for pain and inflammation, such as ibuprofen or naproxen Other medications that cause heart rhythm changes Procarbazine Rasagiline Supplements, such as St. John's wort, kava kava, valerian Tramadol Tryptophan Ziprasidone This list may not describe all possible interactions. Give your health care provider a list of all the medicines, herbs, non-prescription drugs, or dietary supplements you  use. Also tell them if you smoke, drink alcohol, or use illegal drugs. Some items may interact with your medicine. What should I watch for while using this medication? Tell your care team if your symptoms do not get better or if they get worse. Visit your care team for regular checks on your progress. Because it may take several weeks to see the full effects of this medication, it is important to continue your treatment as prescribed by your care team. Watch for new or worsening thoughts of suicide or depression. This includes sudden changes in mood, behaviors, or thoughts. These changes can happen at any time but are more common in the beginning of treatment or after a change in dose. Call your care team right away if you experience these thoughts or worsening depression. This medication may cause mood and behavior changes, such as anxiety, nervousness, irritability, hostility, restlessness, excitability, hyperactivity, or trouble sleeping. These changes can happen at any time but are more common in the beginning of treatment or after a change in dose. Call your care team right away if you notice any of these symptoms. This medication may affect your coordination, reaction time, or judgment. Do not drive or operate machinery until you know how this medication affects you. Sit up or stand slowly to reduce the risk of dizzy or fainting spells. Drinking alcohol with this medication can increase the risk of these side effects. Your mouth may get dry. Chewing sugarless gum or sucking hard candy and drinking plenty of water may help. Contact your care team if the problem does not go away or is severe. What side effects may I notice from receiving this medication? Side effects that  you should report to your care team as soon as possible: Allergic reactions--skin rash, itching, hives, swelling of the face, lips, tongue, or throat Bleeding--bloody or black, tar-like stools, red or dark brown urine, vomiting blood  or brown material that looks like coffee grounds, small, red or purple spots on skin, unusual bleeding or bruising Heart rhythm changes--fast or irregular heartbeat, dizziness, feeling faint or lightheaded, chest pain, trouble breathing Low sodium level--muscle weakness, fatigue, dizziness, headache, confusion Serotonin syndrome--irritability, confusion, fast or irregular heartbeat, muscle stiffness, twitching muscles, sweating, high fever, seizure, chills, vomiting, diarrhea Sudden eye pain or change in vision such as blurry vision, seeing halos around lights, vision loss Thoughts of suicide or self-harm, worsening mood, feelings of depression Side effects that usually do not require medical attention (report to your care team if they continue or are bothersome): Change in sex drive or performance Diarrhea Excessive sweating Nausea Tremors or shaking Upset stomach This list may not describe all possible side effects. Call your doctor for medical advice about side effects. You may report side effects to FDA at 1-800-FDA-1088. Where should I keep my medication? Keep out of reach of children and pets. Store at room temperature between 15 and 30 degrees C (59 and 86 degrees F). Throw away any unused medication after the expiration date. NOTE: This sheet is a summary. It may not cover all possible information. If you have questions about this medicine, talk to your doctor, pharmacist, or health care provider.  2024 Elsevier/Gold Standard (2022-05-28 00:00:00)

## 2024-02-18 ENCOUNTER — Encounter: Payer: Self-pay | Admitting: Psychology

## 2024-03-03 ENCOUNTER — Encounter: Payer: Self-pay | Admitting: Psychiatry

## 2024-03-03 ENCOUNTER — Telehealth (INDEPENDENT_AMBULATORY_CARE_PROVIDER_SITE_OTHER): Admitting: Psychiatry

## 2024-03-03 DIAGNOSIS — R41840 Attention and concentration deficit: Secondary | ICD-10-CM | POA: Diagnosis not present

## 2024-03-03 DIAGNOSIS — F1290 Cannabis use, unspecified, uncomplicated: Secondary | ICD-10-CM

## 2024-03-03 DIAGNOSIS — F411 Generalized anxiety disorder: Secondary | ICD-10-CM | POA: Diagnosis not present

## 2024-03-03 MED ORDER — ESCITALOPRAM OXALATE 5 MG PO TABS: 5.0000 mg | ORAL_TABLET | Freq: Every day | ORAL | 1 refills | Status: DC

## 2024-03-03 NOTE — Progress Notes (Unsigned)
 Virtual Visit via Video Note  I connected with Juan Gonzales on 03/03/24 at 11:00 AM EDT by a video enabled telemedicine application and verified that I am speaking with the correct person using two identifiers.  Location Provider Location : [facility] Patient Location : Work  Participants: Patient , Provider    I discussed the limitations of evaluation and management by telemedicine and the availability of in person appointments. The patient expressed understanding and agreed to proceed.   I discussed the assessment and treatment plan with the patient. The patient was provided an opportunity to ask questions and all were answered. The patient agreed with the plan and demonstrated an understanding of the instructions.   The patient was advised to call back or seek an in-person evaluation if the symptoms worsen or if the condition fails to improve as anticipated.   BH MD OP Progress Note  03/03/2024 11:11 AM Juan Gonzales  MRN:  [id number]  Chief Complaint:  Chief Complaint  Patient presents with   Follow-up   Anxiety   Medication Refill   Discussed the use of AI scribe software for clinical note transcription with the patient, who gave verbal consent to proceed.  History of Present Illness Juan Gonzales is a 28 year old Caucasian male, married, employed, lives in [place], has a history of generalized anxiety disorder, sleep problems, long-term use of cannabis, attention and focus problems, diabetes mellitus type 2, IBS was evaluated by telemedicine today.  He is experiencing brain fog, characterized by forgetting his safe password and address, which began after starting Lexapro  two weeks ago. Despite ongoing anxiety, he has not taken Xanax .  However not sure if brain fog is from the Lexapro  versus the fact that he is currently trying to cut back on cannabis use.  He is aware of withdrawal symptoms that could happen from cannabis as well.  He has been on Lexapro  5 mg for two  weeks, starting the day after his last appointment in early June. He experiences nausea, diarrhea, and gagging, which he describes as 'weird gastro issues'. These symptoms have not worsened but persist. With a history of IBS, he attributes some symptoms to dietary changes, as he is now eating breakfast at 9 AM to take his medication.  He reports increased energy but feels shaky, and jittery after taking Lexapro , however it does not last too long.  It does not affect his day-to-day functioning.  He has had some difficulty falling asleep, staying up until 2 or 3 AM on a few occasions, but overall reports sleeping more than usual.   He has chronic nausea for the past year and a half to two years, which he does not attribute to Lexapro . He typically uses Zofran  for nausea but is concerned about potential interactions with Lexapro .  He is preparing for the birth of his child, which is due today. He has been busy with work and preparing for the baby. No thoughts of self-harm or harm to others. No changes in medications, allergies, or medical problems.  He denies any suicidality, homicidality or perceptual disturbances.  Visit Diagnosis:    ICD-10-CM   1. GAD (generalized anxiety disorder)  F41.1     2. Attention and concentration deficit  R41.840     3. Long term current use of cannabis  F12.90       Past Psychiatric History: I have reviewed past psychiatric history from progress note on 02/13/2024.  Past trials of medications like Zoloft , Prozac, BuSpar  Past  Medical History:  Past Medical History:  Diagnosis Date   Abdominal pain    Anxiety    Diabetes mellitus without complication (HCC)    type 2   Diarrhea    IBS (irritable bowel syndrome)    presumptive    Past Surgical History:  Procedure Laterality Date   ESOPHAGOGASTRODUODENOSCOPY (EGD) WITH PROPOFOL  N/A 01/30/2023   Procedure: ESOPHAGOGASTRODUODENOSCOPY (EGD) WITH PROPOFOL ;  Surgeon: Toledo, Ladell POUR, MD;  Location: [facility]  ENDOSCOPY;  Service: Gastroenterology;  Laterality: N/A;   TONSILLECTOMY     WISDOM TOOTH EXTRACTION      Family Psychiatric History: I have reviewed family psychiatric history from progress note on 02/13/2024.  Family History:  Family History  Problem Relation Age of Onset   Hypertension Father    ADD / ADHD Brother    Autism spectrum disorder Brother    Suicidality Maternal Aunt     Social History: Reviewed social history from progress note on 02/13/2024. Social History   Socioeconomic History   Marital status: Married    Spouse name: Not on file   Number of children: Not on file   Years of education: Not on file   Highest education level: Associate degree: occupational, Scientist, product/process development, or vocational program  Occupational History   Occupation: Production designer, theatre/television/film    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  Tobacco Use   Smoking status: Never    Passive exposure: Never   Smokeless tobacco: Never  Vaping Use   Vaping status: Every Day   Substances: Nicotine, THC, Flavoring  Substance and Sexual Activity   Alcohol use: Yes    Comment: rarely   Drug use: Yes    Types: Marijuana    Comment: 2-3 times a week   Sexual activity: Yes  Other Topics Concern   Not on file  Social History Narrative   Not on file   Social Drivers of Health   Financial Resource Strain: Medium Risk (10/03/2023)   Overall Financial Resource Strain (CARDIA)    Difficulty of Paying Living Expenses: Somewhat hard  Food Insecurity: No Food Insecurity (10/03/2023)   Hunger Vital Sign    Worried About Running Out of Food in the Last Year: Never true    Ran Out of Food in the Last Year: Never true  Transportation Needs: No Transportation Needs (10/03/2023)   PRAPARE - Administrator, Civil Service (Medical): No    Lack of Transportation (Non-Medical): No  Physical Activity: Inactive (10/03/2023)   Exercise Vital Sign    Days of Exercise per Week: 0 days    Minutes of Exercise per Session: 0 min  Stress: Stress Concern  Present (10/03/2023)   [employer] of Occupational Health - Occupational Stress Questionnaire    Feeling of Stress : Very much  Social Connections: Unknown (10/03/2023)   Social Connection and Isolation Panel    Frequency of Communication with Friends and Family: More than three times a week    Frequency of Social Gatherings with Friends and Family: More than three times a week    Attends Religious Services: Patient declined    Database administrator or Organizations: Yes    Attends Engineer, structural: More than 4 times per year    Marital Status: Married    Allergies:  Allergies  Allergen Reactions   Octacosanol    Octacosanol    Hydrocodone Rash    Metabolic Disorder Labs: Lab Results  Component Value Date   HGBA1C 5.1 10/03/2023   MPG 100 10/03/2023   MPG  108 01/17/2023   No results found for: PROLACTIN Lab Results  Component Value Date   CHOL 163 10/03/2023   TRIG 104 10/03/2023   HDL 33 (L) 10/03/2023   CHOLHDL 4.9 10/03/2023   VLDL 18 02/07/2014   LDLCALC 109 (H) 10/03/2023   LDLCALC 123 (H) 01/17/2023   Lab Results  Component Value Date   TSH 1.21 10/03/2023   TSH 1.15 01/17/2023    Therapeutic Level Labs: No results found for: LITHIUM No results found for: VALPROATE No results found for: CBMZ  Current Medications: Current Outpatient Medications  Medication Sig Dispense Refill   ALPRAZolam  (NIRAVAM ) 0.25 MG dissolvable tablet Take 1 tablet (0.25 mg total) by mouth at bedtime as needed for anxiety. 30 tablet 0   escitalopram  (LEXAPRO ) 5 MG tablet Take 1 tablet (5 mg total) by mouth daily with breakfast. 30 tablet 0   ondansetron  (ZOFRAN -ODT) 4 MG disintegrating tablet Take 1 tablet (4 mg total) by mouth every 8 (eight) hours as needed for nausea or vomiting. 20 tablet 0   No current facility-administered medications for this visit.     Musculoskeletal: Strength & Muscle Tone: UTA Gait & Station: Seated Patient leans:  N/A  Psychiatric Specialty Exam: Review of Systems  Psychiatric/Behavioral:  The patient is nervous/anxious.     There were no vitals taken for this visit.There is no height or weight on file to calculate BMI.  General Appearance: Casual  Eye Contact:  Fair  Speech:  Clear and Coherent  Volume:  Normal  Mood:  Anxious  Affect:  Congruent  Thought Process:  Goal Directed and Descriptions of Associations: Intact  Orientation:  Full (Time, Place, and Person)  Thought Content: Logical   Suicidal Thoughts:  No  Homicidal Thoughts:  No  Memory:  Immediate;   Fair Recent;   Fair Remote;   Fair  Judgement:  Fair  Insight:  Fair  Psychomotor Activity:  Normal  Concentration:  Concentration: Fair and Attention Span: Fair  Recall:  [employer] of Knowledge: Fair  Language: Fair  Akathisia:  No  Handed:  Right  AIMS (if indicated): not done  Assets:  Manufacturing systems engineer Desire for Improvement Housing Social Support Transportation  ADL's:  Intact  Cognition: WNL  Sleep:  Improving   Screenings: GAD-7    Flowsheet Row Office Visit from 02/13/2024 in [facility] Office Visit from 10/03/2023 in [facility] Office Visit from 05/15/2023 in [facility] Office Visit from 01/17/2023 in [facility]  Total GAD-7 Score 17 13 15 16    PHQ2-9    Flowsheet Row Office Visit from 02/13/2024 in [facility] Office Visit from 10/03/2023 in [facility] Office Visit from 05/15/2023 in [facility] Office Visit from 01/17/2023 in [facility]  PHQ-2 Total Score 3 4 5 4   PHQ-9 Total Score 16 17 15 18    Flowsheet Row Office Visit from 02/13/2024 in [facility] Admission (Discharged) from 01/30/2023 in [facility]  [facility]COPY  C-SSRS RISK CATEGORY No Risk No Risk     Assessment and Plan: Juan Gonzales is a 28 year old Caucasian male who has a history of anxiety, attention and focus deficit, long-term use of cannabis, was evaluated by telemedicine today.  Discussed assessment and plan as noted below.  Generalized anxiety disorder-improving Currently reports Lexapro  is beneficial although does have GI  symptoms which she also attributes to his IBS.  Has not been able to find a therapist however is motivated to do so. Continue Lexapro  5 mg daily.  Patient to give this more time given his current GI problems. Agrees to reach out to primary care provider for alternate medication options for nausea related to IBS. Encouraged to establish care with therapist to start CBT, provided resources in the community.   attention and concentration deficit-rule out ADHD-unstable Continues to have attention problems, has upcoming appointment scheduled with neuropsychologist. Encouraged to keep the appointment for ADHD testing.  Long-term current use of cannabis-rule out cannabis use disorder-improving Currently weaning off cannabis.  Aware of withdrawal symptoms. Encouraged abstinence.  Follow-up Follow-up in clinic in 6 to 7 weeks or sooner if needed.   Collaboration of Care: Collaboration of Care: Referral or follow-up with counselor/therapist AEB and ways to establish care with therapist.  Patient/Guardian was advised Release of Information must be obtained prior to any record release in order to collaborate their care with an outside provider. Patient/Guardian was advised if they have not already done so to contact the registration department to sign all necessary forms in order for us  to release information regarding their care.   Consent: Patient/Guardian gives verbal consent for treatment and assignment of benefits for services provided during this visit. Patient/Guardian expressed understanding and agreed  to proceed.   This note was generated in part or whole with voice recognition software. Voice recognition is usually quite accurate but there are transcription errors that can and very often do occur. I apologize for any typographical errors that were not detected and corrected.    Jonnie Truxillo, MD 03/03/2024, 11:11 AM

## 2024-03-09 DIAGNOSIS — Z23 Encounter for immunization: Secondary | ICD-10-CM | POA: Diagnosis not present

## 2024-03-31 ENCOUNTER — Ambulatory Visit: Admitting: Psychiatry

## 2024-03-31 ENCOUNTER — Ambulatory Visit: Payer: Self-pay

## 2024-03-31 NOTE — Telephone Encounter (Signed)
 Please make appointment.

## 2024-03-31 NOTE — Telephone Encounter (Signed)
 FYI Only or Action Required?: FYI only for provider.  Patient was last seen in primary care on 10/03/2023 by Gareth Mliss FALCON, FNP.  Called Nurse Triage reporting Foot Pain.  Symptoms began several weeks ago. 2 weeks ago  Interventions attempted: OTC medications: extra strength tylenol.  Symptoms are: gradually worsening.  Triage Disposition: See PCP When Office is Open (Within 3 Days)  Patient/caregiver understands and will follow disposition?: Yes Copied from CRM #[id number]. Topic: Clinical - Red Word Triage >> Mar 31, 2024  3:35 PM Antwanette L wrote: Red Word that prompted transfer to Nurse Triage: Pt is experiencing pain in his right foot. Pt said he cannot wear shoes and its hard for him to walk. Pt needs to make an appt to check a1c Reason for Disposition  [1] MODERATE pain (e.g., interferes with normal activities, limping) AND [2] present > 3 days  Answer Assessment - Initial Assessment Questions 1. ONSET: When did the pain start?      Ongoing for 2 weeks.   2. LOCATION: Where is the pain located?      Right foot,  Starts at 4-5 the toe, radiating to heel.   3. PAIN: How bad is the pain?    (Scale 1-10; or mild, moderate, severe)     5/10 when walking. Not too much pain when  sitting  4. WORK OR EXERCISE: Has there been any recent work or exercise that involved this part of the body?      No  5. CAUSE: What do you think is causing the foot pain?     Unsure of cause, but concerned its relate dto diabetes  6. OTHER SYMPTOMS: Do you have any other symptoms? (e.g., leg pain, rash, fever, numbness)     Entire right foot when numb last night and occasional leg pain.  Protocols used: Foot Pain-A-AH

## 2024-03-31 NOTE — Telephone Encounter (Signed)
 Pt already have appointment scheduled for 04/03/24

## 2024-04-02 ENCOUNTER — Other Ambulatory Visit: Payer: Self-pay | Admitting: Psychiatry

## 2024-04-02 DIAGNOSIS — F411 Generalized anxiety disorder: Secondary | ICD-10-CM

## 2024-04-03 ENCOUNTER — Ambulatory Visit: Admitting: Family Medicine

## 2024-04-03 ENCOUNTER — Ambulatory Visit
Admission: RE | Admit: 2024-04-03 | Discharge: 2024-04-03 | Disposition: A | Discharge status: Home / Self Care | Attending: Family Medicine | Admitting: Family Medicine

## 2024-04-03 ENCOUNTER — Telehealth: Payer: Self-pay | Admitting: Nurse Practitioner

## 2024-04-03 ENCOUNTER — Ambulatory Visit
Admission: RE | Admit: 2024-04-03 | Discharge: 2024-04-03 | Disposition: A | Discharge status: Home / Self Care | Source: Ambulatory Visit | Attending: Family Medicine | Admitting: Family Medicine

## 2024-04-03 ENCOUNTER — Encounter: Payer: Self-pay | Admitting: Family Medicine

## 2024-04-03 VITALS — BP 110/74 | HR 89 | Resp 16 | Ht 71.0 in | Wt 220.9 lb

## 2024-04-03 DIAGNOSIS — M79671 Pain in right foot: Secondary | ICD-10-CM

## 2024-04-03 DIAGNOSIS — E1165 Type 2 diabetes mellitus with hyperglycemia: Secondary | ICD-10-CM | POA: Diagnosis not present

## 2024-04-03 DIAGNOSIS — F411 Generalized anxiety disorder: Secondary | ICD-10-CM

## 2024-04-03 LAB — POCT GLYCOSYLATED HEMOGLOBIN (HGB A1C): Hemoglobin A1C: 5.5 % (ref 4.0–5.6)

## 2024-04-03 MED ORDER — CELECOXIB 100 MG PO CAPS: 100.0000 mg | ORAL_CAPSULE | Freq: Two times a day (BID) | ORAL | 0 refills | Status: AC

## 2024-04-03 NOTE — Telephone Encounter (Signed)
 Copied from CRM [id number]. Topic: General - Call Back - No Documentation >> Apr 03, 2024 12:21 PM Avram MATSU wrote: Reason for CRM: pt would like a callback to discuss with his provider about his recent appt. He stated she was going to send in something for his foot. [phone]

## 2024-04-03 NOTE — Telephone Encounter (Signed)
 Called pt back no answer left detailed vm stating: Orthopedic shoe order has been sent to [facility] store in [place] to please call [phone] for further information.

## 2024-04-03 NOTE — Addendum Note (Signed)
 Addended by: RENTERIA-GARCIA, Lundynn Cohoon on: 04/03/2024 01:10 PM   Modules accepted: Orders

## 2024-04-03 NOTE — Progress Notes (Signed)
 Name: Juan Gonzales   MRN: [id number]    DOB: 1996-01-03   Date:04/03/2024       Progress Note  Subjective  Chief Complaint  Chief Complaint  Patient presents with   Foot Pain    R foot on going for 3 weeks, now calf starting hurt as well, denies injuring it. Has been taking tylenol and icing foot not any better   Discussed the use of AI scribe software for clinical note transcription with the patient, who gave verbal consent to proceed.  History of Present Illness Juan Gonzales is a 28 year old male with type 2 diabetes who presents with right foot pain.  Approximately three weeks ago, he began experiencing pain in his right foot, initially localized to the lateral part of the foot and present only during walking. Over time, the pain progressed to involve the entire foot, including the bottom, and became constant, even at rest. The pain is described as dull, extending from the toes to the ankle, with additional discomfort in the top part of the calf.  He recalls stubbing his pinky toe on the same foot about four months ago, resulting in swelling and discoloration, but the pain resolved after about a week and a half without medical attention or imaging. Recently, he has noticed a 'gnarly pop' in his ankle upon waking, which is loud enough to wake his wife.  He has been managing the pain by massaging the foot, icing, and elevating it.  He mentions that walking on hospital floors in socks for several days may have contributed to the pain.  He has type 2 diabetes, managed primarily through diet, and expresses concern about his A1c levels due to less diligence with his diet since the birth of his child. He does not currently take medication for diabetes and reports no symptoms of hyperglycemia such as increased hunger, thirst, or urination.  He is currently taking Lexapro  for anxiety and has a history of using alprazolam  for emergencies. He has previously taken nausea medication but no  longer uses it.    Patient Active Problem List   Diagnosis Date Noted   Attention and concentration deficit 02/13/2024   Long term current use of cannabis 02/13/2024   Mixed hyperlipidemia 10/03/2023   GAD (generalized anxiety disorder) 01/17/2023   Moderate episode of recurrent major depressive disorder (HCC) 01/17/2023   Irritable bowel syndrome (IBS) 07/09/2011    Past Surgical History:  Procedure Laterality Date   ESOPHAGOGASTRODUODENOSCOPY (EGD) WITH PROPOFOL  N/A 01/30/2023   Procedure: ESOPHAGOGASTRODUODENOSCOPY (EGD) WITH PROPOFOL ;  Surgeon: Toledo, Ladell POUR, MD;  Location: [facility] ENDOSCOPY;  Service: Gastroenterology;  Laterality: N/A;   TONSILLECTOMY     WISDOM TOOTH EXTRACTION      Family History  Problem Relation Age of Onset   Hypertension Father    ADD / ADHD Brother    Autism spectrum disorder Brother    Suicidality Maternal Aunt     Social History   Tobacco Use   Smoking status: Never    Passive exposure: Never   Smokeless tobacco: Never  Substance Use Topics   Alcohol use: Yes    Comment: rarely     Current Outpatient Medications:    escitalopram  (LEXAPRO ) 5 MG tablet, Take 1 tablet (5 mg total) by mouth daily with breakfast., Disp: 30 tablet, Rfl: 1   ondansetron  (ZOFRAN -ODT) 4 MG disintegrating tablet, Take 1 tablet (4 mg total) by mouth every 8 (eight) hours as needed for nausea or vomiting., Disp: 20 tablet,  Rfl: 0   ALPRAZolam  (NIRAVAM ) 0.25 MG dissolvable tablet, Take 1 tablet (0.25 mg total) by mouth at bedtime as needed for anxiety. (Patient not taking: Reported on 04/03/2024), Disp: 30 tablet, Rfl: 0  Allergies  Allergen Reactions   Octacosanol    Octacosanol    Hydrocodone Rash    I personally reviewed active problem list, medication list, allergies with the patient/caregiver today.   ROS  Ten systems reviewed and is negative except as mentioned in HPI    Objective Physical Exam CONSTITUTIONAL: Patient appears well-developed and  well-nourished. No distress. HEENT: Head atraumatic, normocephalic, neck supple. CARDIOVASCULAR: Normal rate, regular rhythm and normal heart sounds. No murmur heard. No BLE edema. PULMONARY: Effort normal and breath sounds normal. No respiratory distress. ABDOMINAL: There is no tenderness or distention. MUSCULOSKELETAL: Antalgic gait . Juan Gonzales Right foot pain on palpation, especially near fourth metatarsal bone, also pain worse when compressing mid foot  PSYCHIATRIC: Patient has a normal mood and affect. Behavior is normal. Judgment and thought content normal.  Vitals:   04/03/24 0940  BP: 110/74  Pulse: 89  Resp: 16  SpO2: 97%  Weight: 220 lb 14.4 oz (100.2 kg)  Height: 5' 11 (1.803 m)    Body mass index is 30.81 kg/m.   PHQ2/9:    04/03/2024    9:40 AM 02/13/2024    2:26 PM 10/03/2023    9:07 AM 05/15/2023    1:50 PM 01/17/2023    2:38 PM  Depression screen PHQ 2/9  Decreased Interest 0  2 2 2   Down, Depressed, Hopeless 0  2 3 2   PHQ - 2 Score 0  4 5 4   Altered sleeping 0  3 3 3   Tired, decreased energy 0  3 3 3   Change in appetite 0  2 0 2  Feeling bad or failure about yourself  0  2 1 3   Trouble concentrating 0  3 3 3   Moving slowly or fidgety/restless 0  0 0 0  Suicidal thoughts 0  0 0 0  PHQ-9 Score 0  17 15 18   Difficult doing work/chores Not difficult at all  Somewhat difficult  Somewhat difficult     Information is confidential and restricted. Go to Review Flowsheets to unlock data.    phq 9 is negative  Fall Risk:    04/03/2024    9:40 AM 10/03/2023    9:02 AM 05/15/2023    1:49 PM 01/17/2023    2:38 PM  Fall Risk   Falls in the past year? 0 0 0 0  Number falls in past yr: 0 0 0 0  Injury with Fall? 0 0 0 0  Risk for fall due to : No Fall Risks  No Fall Risks   Follow up Falls evaluation completed Falls evaluation completed Falls prevention discussed     Assessment & Plan Right foot pain Right foot pain for three weeks, possibly due to fracture or  tendinitis. Pain is constant, dull, and worsens with weight-bearing. Recent ankle popping likely related. Previous calf pain may be due to altered gait. - Order x-ray of the right foot to assess for fracture. - Prescribe Celebrex twice daily with food for inflammation. - Advise non-weight bearing on the right foot, use ortho shoe. - Refer to podiatrist for further evaluation. - Recommend icing and elevation over the weekend.  Type 2 diabetes mellitus Type 2 diabetes managed through diet. A1c 5.5 indicates good control. No hyperglycemia symptoms. - Order A1c test today. - Schedule  follow-up with Mliss in six months. - Perform urine microalbumin test.  Anxiety disorder Anxiety disorder managed with alprazolam  for emergencies. Continues follow-up with Doctor Eap. - Continue alprazolam  for emergency use. - Continue follow-up with Doctor Eap.  Major depressive disorder in full remission Major depressive disorder in full remission, possibly aided by life changes and medication. Continues follow-up with Doctor Eappen. - Continue current management plan with Doctor Eappen.

## 2024-04-04 LAB — MICROALBUMIN / CREATININE URINE RATIO
Creatinine, Urine: 260 mg/dL (ref 20–320)
Microalb Creat Ratio: 3 mg/g{creat} (ref <30)
Microalb, Ur: 0.8 mg/dL

## 2024-04-06 ENCOUNTER — Telehealth: Payer: Self-pay

## 2024-04-06 NOTE — Telephone Encounter (Signed)
 Pt was advised to call several medical supply stores here in town and let us  know if he does find one so we can fax it otherwise I can fax it to another store through online where they can ship it to there home if they have it.

## 2024-04-06 NOTE — Telephone Encounter (Signed)
 Copied from CRM [id number]. Topic: Clinical - Prescription Issue >> Apr 03, 2024  4:41 PM Everette C wrote: Reason for CRM: The patient would like to speak with a member of clinical staff regarding their recently created prescription for an orthopedic shoe. The patient shares that the prescription is only available to them in [place], [place] and they would like to discuss other/closer options. Please contact further when possible

## 2024-04-08 ENCOUNTER — Ambulatory Visit: Payer: Self-pay | Admitting: Internal Medicine

## 2024-04-09 ENCOUNTER — Encounter: Attending: Psychology | Admitting: Psychology

## 2024-04-09 ENCOUNTER — Encounter: Payer: Self-pay | Admitting: Psychology

## 2024-04-09 DIAGNOSIS — R41840 Attention and concentration deficit: Secondary | ICD-10-CM | POA: Insufficient documentation

## 2024-04-09 DIAGNOSIS — F419 Anxiety disorder, unspecified: Secondary | ICD-10-CM | POA: Diagnosis present
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
## 2024-04-10 ENCOUNTER — Ambulatory Visit: Payer: Self-pay | Admitting: Podiatry

## 2024-04-10 ENCOUNTER — Encounter: Payer: Self-pay | Admitting: Podiatry

## 2024-04-10 DIAGNOSIS — M7671 Peroneal tendinitis, right leg: Secondary | ICD-10-CM | POA: Diagnosis not present

## 2024-04-10 DIAGNOSIS — M722 Plantar fascial fibromatosis: Secondary | ICD-10-CM

## 2024-04-10 MED ORDER — DICLOFENAC SODIUM 75 MG PO TBEC: 75.0000 mg | DELAYED_RELEASE_TABLET | Freq: Two times a day (BID) | ORAL | 2 refills | Status: AC

## 2024-04-10 MED ORDER — TRIAMCINOLONE ACETONIDE 10 MG/ML IJ SUSP
10.0000 mg | Freq: Once | INTRAMUSCULAR | Status: AC
  Administered 2024-04-10: 10 mg via INTRA_ARTICULAR

## 2024-04-13 NOTE — Progress Notes (Signed)
 Subjective:   Patient ID: Juan Gonzales, male   DOB: 28 y.o.   MRN: [id number]   HPI Patient presents stating has been having a lot of pain in his left foot and it has been hard to walk on.  He does not remember specific injury but states it seems to be hurting in different areas and that he is not sure exactly where it is coming from.  Patient does not smoke and likes to be active   Review of Systems  All other systems reviewed and are negative.       Objective:  Physical Exam Vitals and nursing note reviewed.  Constitutional:      Appearance: He is well-developed.  Pulmonary:     Effort: Pulmonary effort is normal.  Musculoskeletal:        General: Normal range of motion.  Skin:    General: Skin is warm.  Neurological:     Mental Status: He is alert.     Neurovascular status intact muscle strength was found to be adequate range of motion adequate exquisite discomfort lateral side right foot around the base of the fifth metatarsal and also into the plantar heel lateral side.  Patient is found to have good digital perfusion well-oriented x 3     Assessment:  Appears to be an acute inflammatory condition with patient having no ability to really walk on his foot due to the discomfort he experienced with no indication of muscle strength loss      Plan:  H&P done condition discussed and I reviewed with him what can happen with this.  At this point I have recommended immobilization with boot and I did do careful injection after explaining risk into the plantar lateral heel and along the peroneal course 3 mg dexamethasone Kenalog  5 mg Xylocaine applying sterile dressing.  Reappoint 3 to 4 weeks or earlier if needed  X-rays indicate that there is no signs of a fracture or bony injury associated with condition

## 2024-04-14 DIAGNOSIS — Z419 Encounter for procedure for purposes other than remedying health state, unspecified: Secondary | ICD-10-CM | POA: Diagnosis not present

## 2024-04-23 ENCOUNTER — Telehealth (INDEPENDENT_AMBULATORY_CARE_PROVIDER_SITE_OTHER): Admitting: Psychiatry

## 2024-04-23 ENCOUNTER — Encounter: Payer: Self-pay | Admitting: Psychiatry

## 2024-04-23 DIAGNOSIS — F1290 Cannabis use, unspecified, uncomplicated: Secondary | ICD-10-CM

## 2024-04-23 DIAGNOSIS — R41840 Attention and concentration deficit: Secondary | ICD-10-CM

## 2024-04-23 DIAGNOSIS — F17200 Nicotine dependence, unspecified, uncomplicated: Secondary | ICD-10-CM

## 2024-04-23 DIAGNOSIS — F17290 Nicotine dependence, other tobacco product, uncomplicated: Secondary | ICD-10-CM

## 2024-04-23 DIAGNOSIS — F411 Generalized anxiety disorder: Secondary | ICD-10-CM | POA: Diagnosis not present

## 2024-04-23 MED ORDER — ESCITALOPRAM OXALATE 5 MG PO TABS
7.5000 mg | ORAL_TABLET | Freq: Every day | ORAL | 1 refills | Status: DC
Start: 2024-04-23 — End: 2024-05-27

## 2024-04-23 NOTE — Progress Notes (Unsigned)
 Virtual Visit via Video Note  I connected with Juan Gonzales on 04/23/24 at  1:20 PM EDT by a video enabled telemedicine application and verified that I am speaking with the correct person using two identifiers.  Location Provider Location : [facility] Patient Location : Home  Participants: Patient , Provider    I discussed the limitations of evaluation and management by telemedicine and the availability of in person appointments. The patient expressed understanding and agreed to proceed.   I discussed the assessment and treatment plan with the patient. The patient was provided an opportunity to ask questions and all were answered. The patient agreed with the plan and demonstrated an understanding of the instructions.   The patient was advised to call back or seek an in-person evaluation if the symptoms worsen or if the condition fails to improve as anticipated.   BH MD OP Progress Note  04/23/2024 1:41 PM KATRELL MILHORN  MRN:  [id number]  Chief Complaint:  Chief Complaint  Patient presents with   Follow-up   Anxiety   Depression   Medication Refill   Discussed the use of AI scribe software for clinical note transcription with the patient, who gave verbal consent to proceed.  History of Present Illness Juan Gonzales is a 28 year old Caucasian male, married, employed, lives in [place], has a history of generalized anxiety disorder, sleep problems, long-term use of cannabis, attention and focus problems, diabetes mellitus type 2, IBS was evaluated by telemedicine today.  He reports experiencing 4 to 5 anxiety attacks at work yesterday, which he links to missing his usual morning dose of Lexapro  and instead taking it at 8 PM. Since starting Lexapro , he notes this was the first time he has experienced anxiety attacks and feels the medication is working well overall. He currently takes Lexapro , typically at 9 AM in the morning, and after resuming his dose, he noticed improvement in  his anxiety within an hour. He reports that he slept better after taking Lexapro  at night, though he remains unsure if this results from the medication or fatigue. He reports a weight gain of approximately 8 pounds since starting Lexapro  but remains uncertain if this relates to the medication or lifestyle changes after the birth of his child.  Ongoing intrusive thoughts specifically related to driving, characterized by fears of failure and causing harm while behind the wheel, continue to affect him. He denies any current thoughts of hurting himself or others and states that intrusive thoughts occur only in the context of driving. He has not yet resumed driving and is working toward making this a goal. He has not yet established care with a therapist due to a busy schedule and recent medical appointments for a foot injury.  He states he has not yet found a therapist and has not made any therapy appointments.  He reports current vaping, using 0.15 mg nicotine, which represents the last step before discontinuation. He states he has not smoked cigarettes in 7 or 8 years and does not plan to resume. He describes being in the process of quitting nicotine altogether and is considering adjusting the nicotine dose to 7.5 mg by combining 1.5 of the 5 mg cartridges. He reports past cannabis use but states he is not currently using cannabis. He expresses interest in occasional social alcohol use (1 or 2 drinks) and seeks provider approval due to current Lexapro  use.  He is currently employed. He lives at home with his spouse and infant daughter (57 weeks old). He  reports having friends and occasionally socializes by having friends over.    Visit Diagnosis:    ICD-10-CM   1. GAD (generalized anxiety disorder)  F41.1 escitalopram  (LEXAPRO ) 5 MG tablet    2. Attention and concentration deficit  R41.840     3. Long term current use of cannabis  F12.90     4. Nicotine use disorder  F17.200    Moderate       Past Psychiatric History: I have reviewed past psychiatric history from progress note on 02/13/2024.  Past also medications like Zoloft , Prozac, BuSpar.  Past Medical History:  Past Medical History:  Diagnosis Date   Abdominal pain    Anxiety    Diabetes mellitus without complication (HCC)    type 2   Diarrhea    IBS (irritable bowel syndrome)    presumptive    Past Surgical History:  Procedure Laterality Date   ESOPHAGOGASTRODUODENOSCOPY (EGD) WITH PROPOFOL  N/A 01/30/2023   Procedure: ESOPHAGOGASTRODUODENOSCOPY (EGD) WITH PROPOFOL ;  Surgeon: Toledo, Ladell POUR, MD;  Location: [facility];  Service: Gastroenterology;  Laterality: N/A;   TONSILLECTOMY     WISDOM TOOTH EXTRACTION      Family Psychiatric History: I have reviewed family psychiatric history from progress note on 02/13/2024.  Family History:  Family History  Problem Relation Age of Onset   Hypertension Father    ADD / ADHD Brother    Autism spectrum disorder Brother    Suicidality Maternal Aunt     Social History: I have reviewed social history from progress note on 02/13/2024. 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  Tobacco Use   Smoking status: Former    Current packs/day: 0.00    Types: Cigarettes    Quit date: 03/24/2018    Years since quitting: 6.0    Passive exposure: Never   Smokeless tobacco: Never  Vaping Use   Vaping status: Every Day   Substances: Nicotine, THC, Flavoring  Substance and Sexual Activity   Alcohol use: Yes    Comment: rarely   Drug use: Not Currently    Types: Marijuana   Sexual activity: Yes  Other Topics Concern   Not on file  Social History Narrative   Not on file   Social Drivers of Health    Financial Resource Strain: Medium Risk (10/03/2023)   Overall Financial Resource Strain (CARDIA)    Difficulty of Paying Living Expenses: Somewhat hard  Food Insecurity: No Food Insecurity (10/03/2023)   Hunger Vital Sign    Worried About Running Out of Food in the Last Year: Never true    Ran Out of Food in the Last Year: Never true  Transportation Needs: No Transportation Needs (10/03/2023)   PRAPARE - Administrator, Civil Service (Medical): No    Lack of Transportation (Non-Medical): No  Physical Activity: Inactive (10/03/2023)   Exercise Vital Sign    Days of Exercise per Week: 0 days    Minutes of Exercise per Session: 0 min  Stress: Stress Concern Present (10/03/2023)   [employer] of Occupational Health - Occupational Stress Questionnaire    Feeling of Stress : Very much  Social Connections: Unknown (10/03/2023)   Social Connection and Isolation Panel  Frequency of Communication with Friends and Family: More than three times a week    Frequency of Social Gatherings with Friends and Family: More than three times a week    Attends Religious Services: Patient declined    Database administrator or Organizations: Yes    Attends Engineer, structural: More than 4 times per year    Marital Status: Married    Allergies:  Allergies  Allergen Reactions   Octacosanol    Octacosanol    Hydrocodone Rash    Metabolic Disorder Labs: Lab Results  Component Value Date   HGBA1C 5.5 04/03/2024   MPG 100 10/03/2023   MPG 108 01/17/2023   No results found for: PROLACTIN Lab Results  Component Value Date   CHOL 163 10/03/2023   TRIG 104 10/03/2023   HDL 33 (L) 10/03/2023   CHOLHDL 4.9 10/03/2023   VLDL 18 02/07/2014   LDLCALC 109 (H) 10/03/2023   LDLCALC 123 (H) 01/17/2023   Lab Results  Component Value Date   TSH 1.21 10/03/2023   TSH 1.15 01/17/2023    Therapeutic Level Labs: No results found for: LITHIUM No results found for:  VALPROATE No results found for: CBMZ  Current Medications: Current Outpatient Medications  Medication Sig Dispense Refill   ALPRAZolam  (NIRAVAM ) 0.25 MG dissolvable tablet Take 1 tablet (0.25 mg total) by mouth at bedtime as needed for anxiety. 30 tablet 0   celecoxib  (CELEBREX ) 100 MG capsule Take 1 capsule (100 mg total) by mouth 2 (two) times daily. 60 capsule 0   diclofenac  (VOLTAREN ) 75 MG EC tablet Take 1 tablet (75 mg total) by mouth 2 (two) times daily. 50 tablet 2   escitalopram  (LEXAPRO ) 5 MG tablet Take 1.5 tablets (7.5 mg total) by mouth daily with breakfast. 45 tablet 1   No current facility-administered medications for this visit.     Musculoskeletal: Strength & Muscle Tone: UTA Gait & Station: Seated Patient leans: N/A  Psychiatric Specialty Exam: Review of Systems  Psychiatric/Behavioral:  Positive for decreased concentration. The patient is nervous/anxious.     There were no vitals taken for this visit.There is no height or weight on file to calculate BMI.  General Appearance: Casual  Eye Contact:  Fair  Speech:  Clear and Coherent  Volume:  Normal  Mood:  Anxious  Affect:  Appropriate  Thought Process:  Goal Directed and Descriptions of Associations: Intact  Orientation:  Full (Time, Place, and Person)  Thought Content: Logical   Suicidal Thoughts:  No  Homicidal Thoughts:  No  Memory:  Immediate;   Fair Recent;   Fair Remote;   Fair  Judgement:  Fair  Insight:  Fair  Psychomotor Activity:  Normal  Concentration:  Concentration: Fair and Attention Span: Fair  Recall:  [employer] of Knowledge: Fair  Language: Fair  Akathisia:  No  Handed:  Right  AIMS (if indicated): not done  Assets:  Communication Skills Desire for Improvement Housing Social Support Transportation  ADL's:  Intact  Cognition: WNL  Sleep:  Fair   Screenings: GAD-7    Garment/textile technologist Visit from 04/03/2024 in [facility] Office Visit from  02/13/2024 in [facility] Office Visit from 10/03/2023 in [facility] Office Visit from 05/15/2023 in [facility] Office Visit from 01/17/2023 in [facility]  Total GAD-7 Score 21 17 13 15 16    PHQ2-9    Flowsheet Row  Office Visit from 04/03/2024 in [facility] Office Visit from 02/13/2024 in [facility] Office Visit from 10/03/2023 in [facility] Visit from 05/15/2023 in [facility] Visit from 01/17/2023 in [facility]  PHQ-2 Total Score 0 3 4 5 4   PHQ-9 Total Score 0 16 17 15 18    Flowsheet Row Video Visit from 04/23/2024 in [facility] Video Visit from 03/03/2024 in [facility] Office Visit from 02/13/2024 in [facility]  C-SSRS RISK CATEGORY No Risk No Risk No Risk     Assessment and Plan: HIEU HERMS is a 28 year old Caucasian male who has a history of anxiety, attention and focus deficit, long-term current use of cannabis, nicotine use disorder was evaluated by telemedicine today.  Discussed assessment and plan as noted below.  Generalized anxiety disorder-improving Although anxiety symptoms are currently improved he is interested in trial of slightly increased dosage of Lexapro  to know if it will be more beneficial at that dosage.  Increase Lexapro  to 7.5 mg daily based on patient request. Encouraged to establish care with therapist patient provided resources.  Nicotine use disorder-unstable Currently cutting back. Will monitor closely.  Attention concentration deficit-rule out ADHD-has upcoming appointment with Dr. Evalene Riff.  Long-term current use of cannabis currently in  remission Currently denies any recent use. Will reevaluate in future sessions.  Follow-up Follow-up in clinic in 6 weeks or sooner in person.   Collaboration of Care: Collaboration of Care: Referral or follow-up with counselor/therapist AEB encouraged to establish care with therapist.  Patient/Guardian was advised Release of Information must be obtained prior to any record release in order to collaborate their care with an outside provider. Patient/Guardian was advised if they have not already done so to contact the registration department to sign all necessary forms in order for us  to release information regarding their care.   Consent: Patient/Guardian gives verbal consent for treatment and assignment of benefits for services provided during this visit. Patient/Guardian expressed understanding and agreed to proceed.  This note was generated in part or whole with voice recognition software. Voice recognition is usually quite accurate but there are transcription errors that can and very often do occur. I apologize for any typographical errors that were not detected and corrected.     Jennessa Trigo, MD 04/24/2024, 7:08 AM

## 2024-04-24 ENCOUNTER — Encounter

## 2024-04-24 DIAGNOSIS — R41840 Attention and concentration deficit: Secondary | ICD-10-CM

## 2024-04-24 DIAGNOSIS — F419 Anxiety disorder, unspecified: Secondary | ICD-10-CM | POA: Diagnosis not present

## 2024-04-24 DIAGNOSIS — F17200 Nicotine dependence, unspecified, uncomplicated: Secondary | ICD-10-CM | POA: Insufficient documentation

## 2024-04-24 NOTE — Progress Notes (Signed)
 Behavioral Observations:  The patient's hearing and vision were adequate for testing. He ambulated independently and without issue. No hand tremor was noted during testing. His speech was prosodic, fluent, and well-articulated. He displayed no clear indications of word-finding difficulties in conversational speech and no notable paraphasic errors were noted. Receptive language appeared intact. The patient's affect was congruent with mood and his mood was largely neutral. The patient was alert and participated in testing as instructed. He was cooperative throughout the session. His pace was steady. He showed some very mild difficulties with frustration tolerance, especially towards the end of testing. The patient's social interactions were unremarkable and consistent with the setting. No frank attentional lapses were appreciated.  Neuropsychology Note  Juan Gonzales completed 130 minutes of neuropsychological testing with technician, Juan Gonzales, BA, under the supervision of Juan Riff, PsyD., Clinical Neuropsychologist. The patient did not appear overtly distressed by the testing session, per behavioral observation or via self-report to the technician. Rest breaks were offered.   Clinical Decision Making: In considering the patient's current level of functioning, level of presumed impairment, nature of symptoms, emotional and behavioral responses during clinical interview, level of literacy, and observed level of motivation/effort, a battery of tests was selected by Dr. Riff during initial consultation on 04/09/2024. This was communicated to the technician. Communication between the neuropsychologist and technician was ongoing throughout the testing session and changes were made as deemed necessary based on patient performance on testing, technician observations and additional pertinent factors such as those listed above.  Tests Administered: Conners Continuous Performance Test 3rd Edition  (CPT-3) Delis-Kaplan Executive Function System (D-KEFS), select subtests Rey Complex Figure Test (RCFT), select subtests Wechsler Adult Intelligence Scale-Fourth Edition (WAIS-IV), select subtests Wechsler Abbreviated Scale of Intelligence Civil Service fast streamer) Wechsler Memory Scale-Third Edition (WMS-III), select subtests  [place]  Card Sorting Test [id number]) The Beck Depression Inventory-II (BDI-II) Beck Anxiety Inventory (BAI)  Results: Note: This summary of test scores accompanies the interpretive report and should not be interpreted by unqualified individuals or in isolation without reference to the report. Test scores are relative to age, gender, and educational history as available and appropriate.   Measurement properties of test scores: IQ, Index, and Standard Scores (SS): Mean = 100; Standard Deviation = 15; Scaled Scores (ss): Mean = 10; Standard Deviation = 3; Z scores (Z): Mean = 0; Standard Deviation = 1; T scores (T); Mean = 50; Standard Deviation = 10  Intellectual/Premorbid Functioning Estimate   Norm Score Percentile Range  WASI-II FSIQ-4  SS = 85 16 %ile Mildly Impaired   Vocabulary   t = 44 27 %ile Average   Matrix Reasoning   t = 41 18 %ile Low Average   Block Design  t = 41 18 %ile Low Average   Similarities  t = 42 21 %ile Low Average   ATTENTION AND WORKING MEMORY    Norm Score Percentile  Range  WAIS-IV          Digit Span  ss = 8 25 %ile Average   DSF  ss = 10 50 %ile Average   Span:    7      DSB  ss = 7 16 %ile Low Average   Span:    3      DSS  ss = 8 25 %ile Average   Span:    6     WMS-III          Spatial Span  ss = 9 37 %ile Average  SSF  ss = 9 37 %ile Average   Span:    5      SSB  ss = 10 50 %ile Average   Span:    5      COMPLEX ATTENTION    Norm Score Percentile Descriptor  CPT-3          Response Style          Detectability (reverse scored)  t = 57 74 %ile    Omission Errors  t = 45 37 %ile    Commission Errors  t = 62 86 %ile     Perseverations  t = 52 82 %ile    HRT (reaction time)  t = 46 38 %ile    HRT SD (reaction time consistency) t = 51 60 %ile    Variability (in RT consistency)  t = 44 26 %ile    HRT Block Change (over test)  t = 55 72 %ile    HRT ISI Change (by stimuli interval) t = 56 82 %ile    PROCESSING SPEED    Norm Score Percentile  Range  WAIS-IV          Coding  ss = 7 16 %ile Low Average   Symbol Search  ss = 10 50 %ile Average   EXECUTIVE FUNCTIONING    Norm Score Percentile  Range  DKEFS - Color-Word Interference          Color Naming  ss = 9 37 %ile Average   Word Reading  ss = 9 37 %ile Average   Inhibition  ss = 7 16 %ile Low Average   Errors  ss = 7 16 %ile Low Average   Inhibition Switching  ss = 9 37 %ile Average   Errors  ss = 11 63 %ile Average  DKEFS - Trails          Condition 1  ss = 8 25 %ile Average   Condition II  ss = 7 16 %ile Low Average   Condition III  ss = 6 9 %ile Low Average   Condition IV  ss = 9 37 %ile Average   Condition V  ss = 4 2 %ile Below Average  [place]  Card Sorting Test (WCST)          Total Errors  t = 33 4 %ile Below Average   Perseverative Errors  t = 34 5 %ile Below Average   Non-Perseverative Errors  t = 33 5 %ile Below Average   % Conceptual Responses  t = 33 4 %ile Below Average   Categories Completed     6 to 10  %ile Low to Below Average   Trials to First Category Complete     >16 %ile WNL   Failure to Maintain Set     >16 %ile WNL   VISUAL-SPATIAL    Norm Score Percentile  Range  Rey Complex Figure Copy       <=1 %ile Exceptionally Low   PERSONALITY AND BEHAVIORAL FUNCTIONING      Score/Interpretation  BDI Raw       27  BDI Severity       Moderate.  BAI Raw       43  BAI Severity       Severe.    Feedback to Patient: Juan Gonzales will return on 05/01/2024 for an interactive feedback session with Dr. Hayden at which time his test performances, clinical impressions and treatment recommendations will be reviewed  in detail. The  patient understands he can contact our office should he require our assistance before this time.  130 minutes spent face-to-face with patient administering standardized tests, 30 minutes spent scoring Radiographer, therapeutic). [CPT [id number], [id number]]  Full report to follow.

## 2024-04-28 ENCOUNTER — Encounter: Admitting: Psychology

## 2024-04-28 DIAGNOSIS — F908 Attention-deficit hyperactivity disorder, other type: Secondary | ICD-10-CM

## 2024-04-28 DIAGNOSIS — R41840 Attention and concentration deficit: Secondary | ICD-10-CM | POA: Diagnosis not present

## 2024-04-28 DIAGNOSIS — F411 Generalized anxiety disorder: Secondary | ICD-10-CM | POA: Diagnosis not present

## 2024-05-01 ENCOUNTER — Encounter (HOSPITAL_BASED_OUTPATIENT_CLINIC_OR_DEPARTMENT_OTHER): Admitting: Psychology

## 2024-05-01 DIAGNOSIS — F411 Generalized anxiety disorder: Secondary | ICD-10-CM | POA: Diagnosis not present

## 2024-05-01 DIAGNOSIS — F908 Attention-deficit hyperactivity disorder, other type: Secondary | ICD-10-CM

## 2024-05-06 ENCOUNTER — Ambulatory Visit: Admitting: Podiatry

## 2024-05-15 DIAGNOSIS — Z419 Encounter for procedure for purposes other than remedying health state, unspecified: Secondary | ICD-10-CM | POA: Diagnosis not present

## 2024-05-27 ENCOUNTER — Other Ambulatory Visit: Payer: Self-pay | Admitting: Psychiatry

## 2024-05-27 DIAGNOSIS — F411 Generalized anxiety disorder: Secondary | ICD-10-CM

## 2024-06-04 ENCOUNTER — Ambulatory Visit: Admitting: Psychiatry

## 2024-06-23 ENCOUNTER — Ambulatory Visit: Admitting: Psychiatry

## 2024-06-26 ENCOUNTER — Telehealth (INDEPENDENT_AMBULATORY_CARE_PROVIDER_SITE_OTHER): Admitting: Psychiatry

## 2024-06-26 ENCOUNTER — Encounter: Payer: Self-pay | Admitting: Psychiatry

## 2024-06-26 DIAGNOSIS — R41840 Attention and concentration deficit: Secondary | ICD-10-CM | POA: Diagnosis not present

## 2024-06-26 DIAGNOSIS — F17200 Nicotine dependence, unspecified, uncomplicated: Secondary | ICD-10-CM

## 2024-06-26 DIAGNOSIS — F1290 Cannabis use, unspecified, uncomplicated: Secondary | ICD-10-CM

## 2024-06-26 DIAGNOSIS — F411 Generalized anxiety disorder: Secondary | ICD-10-CM

## 2024-06-26 MED ORDER — ESCITALOPRAM OXALATE 5 MG PO TABS: 2.5000 mg | ORAL_TABLET | Freq: Every day | ORAL | Status: DC

## 2024-06-26 MED ORDER — VENLAFAXINE HCL ER 37.5 MG PO CP24: 37.5000 mg | ORAL_CAPSULE | Freq: Every day | ORAL | 1 refills | Status: DC

## 2024-06-26 NOTE — Patient Instructions (Signed)
Venlafaxine Extended-Release Capsules What is this medication? VENLAFAXINE (VEN la fax een) treats depression and anxiety. It increases the amount of serotonin and norepinephrine in the brain, hormones that help regulate mood. It belongs to a group of medications called SNRIs. This medicine may be used for other purposes; ask your health care provider or pharmacist if you have questions. COMMON BRAND NAME(S): Effexor XR What should I tell my care team before I take this medication? They need to know if you have any of these conditions: Bleeding disorders Glaucoma Heart disease High blood pressure High cholesterol Kidney disease Liver disease Low levels of sodium in the blood Mania or bipolar disorder Seizures Suicidal thoughts, plans, or attempt by you or a family member Take medications that treat or prevent blood clots Thyroid disease An unusual or allergic reaction to venlafaxine, other medications, foods, dyes, or preservatives Pregnant or trying to get pregnant Breastfeeding How should I use this medication? Take this medication by mouth with a full glass of water. Take it as directed on the prescription label. Do not cut, crush, or chew this medication. Take it with food. You may open the capsule and put the contents in 1 teaspoon of applesauce. Swallow the medication and applesauce right away. Do not chew the medication or applesauce. Follow with a glass of water to ensure complete swallowing of the pellets. Try to take your medication at about the same time each day. Do not take your medication more often than directed. Keep taking this medication unless your care team tells you to stop. Stopping it too quickly can cause serious side effects. It can also make your condition worse. A special MedGuide will be given to you by the pharmacist with each prescription and refill. Be sure to read this information carefully each time. Talk to your care team about the use of this medication in  children. Special care may be needed. Overdosage: If you think you have taken too much of this medicine contact a poison control center or emergency room at once. NOTE: This medicine is only for you. Do not share this medicine with others. What if I miss a dose? If you miss a dose, take it as soon as you can. If it is almost time for your next dose, take only that dose. Do not take double or extra doses. What may interact with this medication? Do not take this medication with any of the following: Alcohol Certain medications for fungal infections, such as fluconazole, itraconazole, ketoconazole, posaconazole, voriconazole Cisapride Desvenlafaxine Dronedarone Duloxetine Levomilnacipran Linezolid MAOIs, such as Carbex, Eldepryl, Marplan, Nardil, and Parnate Methylene blue (injected into a vein) Milnacipran Pimozide Thioridazine This medication may also interact with the following: Amphetamines Aspirin and aspirin-like medications Certain medications for mental health conditions Certain medications for migraine headaches, such as almotriptan, eletriptan, frovatriptan, naratriptan, rizatriptan, sumatriptan, zolmitriptan Certain medications for sleep Certain medications that treat or prevent blood clots, such as dalteparin, enoxaparin, warfarin Cimetidine Clozapine Diuretics Fentanyl Furazolidone Indinavir Isoniazid Lithium Metoprolol NSAIDS, medications for pain and inflammation, such as ibuprofen or naproxen Other medications that cause heart rhythm changes Procarbazine Rasagiline Supplements, such as St. John's wort, kava kava, valerian Tramadol Tryptophan This list may not describe all possible interactions. Give your health care provider a list of all the medicines, herbs, non-prescription drugs, or dietary supplements you use. Also tell them if you smoke, drink alcohol, or use illegal drugs. Some items may interact with your medicine. What should I watch for while using  this medication? Tell   your care team if your symptoms do not get better or if they get worse. Visit your care team for regular checks on your progress. Because it may take several weeks to see the full effects of this medication, it is important to continue your treatment as prescribed by your care team. Watch for new or worsening thoughts of suicide or depression. This includes sudden changes in mood, behaviors, or thoughts. These changes can happen at any time but are more common in the beginning of treatment or after a change in dose. Call your care team right away if you experience these thoughts or worsening depression. This medication may cause mood and behavior changes, such as anxiety, nervousness, irritability, hostility, restlessness, excitability, hyperactivity, or trouble sleeping. These changes can happen at any time but are more common in the beginning of treatment or after a change in dose. Call your care team right away if you notice any of these symptoms. This medication can cause an increase in blood pressure. Check with your care team for instructions on monitoring your blood pressure while taking this medication. This medication may affect your coordination, reaction time, or judgment. Do not drive or operate machinery until you know how this medication affects you. Sit up or stand slowly to reduce the risk of dizzy or fainting spells. Drinking alcohol with this medication can increase the risk of these side effects. Your mouth may get dry. Chewing sugarless gum or sucking hard candy and drinking plenty of water may help. Contact your care team if the problem does not go away or is severe. What side effects may I notice from receiving this medication? Side effects that you should report to your care team as soon as possible: Allergic reactions--skin rash, itching, hives, swelling of the face, lips, tongue, or throat Bleeding--bloody or black, tar-like stools, red or dark brown urine,  vomiting blood or brown material that looks like coffee grounds, small, red or purple spots on skin, unusual bleeding or bruising Heart rhythm changes--fast or irregular heartbeat, dizziness, feeling faint or lightheaded, chest pain, trouble breathing Increase in blood pressure Loss of appetite with weight loss Low sodium level--muscle weakness, fatigue, dizziness, headache, confusion Serotonin syndrome--irritability, confusion, fast or irregular heartbeat, muscle stiffness, twitching muscles, sweating, high fever, seizures, chills, vomiting, diarrhea Sudden eye pain or change in vision such as blurry vision, seeing halos around lights, vision loss Thoughts of suicide or self-harm, worsening mood, feelings of depression Side effects that usually do not require medical attention (report to your care team if they continue or are bothersome): Anxiety, nervousness Change in sex drive or performance Dizziness Dry mouth Excessive sweating Nausea Tremors or shaking Trouble sleeping This list may not describe all possible side effects. Call your doctor for medical advice about side effects. You may report side effects to FDA at 1-800-FDA-1088. Where should I keep my medication? Keep out of the reach of children and pets. Store at a controlled temperature between 20 and 25 degrees C (68 degrees and 77 degrees F), in a dry place. Throw away any unused medication after the expiration date. NOTE: This sheet is a summary. It may not cover all possible information. If you have questions about this medicine, talk to your doctor, pharmacist, or health care provider.  2024 Elsevier/Gold Standard (2022-08-16 00:00:00)  

## 2024-06-26 NOTE — Progress Notes (Signed)
 Virtual Visit via Video Note  I connected with Juan Gonzales on 06/26/24 at  9:30 AM EDT by a video enabled telemedicine application and verified that I am speaking with the correct person using two identifiers.  Location Provider Location : [facility] Patient Location : Home  Participants: Patient , Provider    I discussed the limitations of evaluation and management by telemedicine and the availability of in person appointments. The patient expressed understanding and agreed to proceed.   I discussed the assessment and treatment plan with the patient. The patient was provided an opportunity to ask questions and all were answered. The patient agreed with the plan and demonstrated an understanding of the instructions.   The patient was advised to call back or seek an in-person evaluation if the symptoms worsen or if the condition fails to improve as anticipated.  [facility] MD OP Progress Note  06/26/2024 10:00 AM Juan Gonzales  MRN:  [id number]  Chief Complaint:  Chief Complaint  Patient presents with   Anxiety   Medication Refill   Medication Problem   attention issue   Discussed the use of AI scribe software for clinical note transcription with the patient, who gave verbal consent to proceed.  History of Present Illness Juan Gonzales is a 28 year old Caucasian male, married, employed, lives in [place], has a history of generalized anxiety disorder, long-term use of cannabis, attention and focus problems, diabetes mellitus type 2, IBS, was evaluated by telemedicine today for a follow-up appointment.  He describes ongoing and worsening anxiety, noting persistent anxious feelings in various situations, including social events such as a recent concert, where he felt anxious before, during, and after the event. He states that this response is unusual for him, as concerts typically have a calming effect. Additional sources of stress include work and caring for his infant. He describes  himself as feeling like an anxious wreck and also notes some improvement in mood related to positive experiences with his baby. He has not engaged with a therapist recently, citing work and family responsibilities as barriers.  He currently takes Lexapro  5 mg at night. He reports several side effects, including oversleeping, waking up sore, significant daytime fatigue when he takes it in the morning, jaw pain, and jaw clenching. He notes that taking Lexapro  during the day increases his anxiety and causes pressure in his teeth, which leads him to clench his jaw. He reports that Lexapro  initially provided benefit, but he now experiences increasing anxiety with continued use. He expresses a desire to discontinue Lexapro  due to these side effects and inquires about the need to taper the dose.  He reports feeling less depressed recently and relates this improvement to positive experiences with his baby. He describes feeling extra tired recently but reports sleeping well overall. When asked directly, he denies any thoughts of harming himself or others.  He previously trialed sertraline , Prozac, and Buspar for anxiety, discontinuing all due to side effects or lack of efficacy. He took Buspar three times but discontinued it due to persistent anxiety.   He reports current nicotine use via vaping, using a vape with minimal nicotine, which he describes as the lowest available strength before quitting. He states ongoing efforts to taper down nicotine use. He denies current cannabis use and confirms that the vape does not contain THC. He does not mention use of other substances.   Visit Diagnosis:    ICD-10-CM   1. GAD (generalized anxiety disorder)  F41.1 escitalopram  (LEXAPRO ) 5 MG tablet  venlafaxine XR (EFFEXOR-XR) 37.5 MG 24 hr capsule    2. Attention and concentration deficit  R41.840     3. Long term current use of cannabis  F12.90     4. Nicotine use disorder  F17.200    Mild      Past  Psychiatric History: I have reviewed past psychiatric history from progress note on 02/13/2024.  Past trials of medications like Zoloft , Prozac, BuSpar, Lexapro .  Past Medical History:  Past Medical History:  Diagnosis Date   Abdominal pain    Anxiety    Diabetes mellitus without complication (HCC)    type 2   Diarrhea    IBS (irritable bowel syndrome)    presumptive    Past Surgical History:  Procedure Laterality Date   ESOPHAGOGASTRODUODENOSCOPY (EGD) WITH PROPOFOL  N/A 01/30/2023   Procedure: ESOPHAGOGASTRODUODENOSCOPY (EGD) WITH PROPOFOL ;  Surgeon: Toledo, Ladell POUR, MD;  Location: [facility] ENDOSCOPY;  Service: Gastroenterology;  Laterality: N/A;   TONSILLECTOMY     WISDOM TOOTH EXTRACTION      Family Psychiatric History: I have reviewed family psychiatric history from progress note on 02/13/2024.  Family History:  Family History  Problem Relation Age of Onset   Hypertension Father    ADD / ADHD Brother    Autism spectrum disorder Brother    Suicidality Maternal Aunt     Social History: I have reviewed social history from progress note on 02/13/2024. 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  Tobacco Use   Smoking status: Former    Current packs/day: 0.00    Types: Cigarettes    Quit date: 03/24/2018    Years since quitting: 6.2    Passive exposure: Never   Smokeless tobacco: Never  Vaping Use   Vaping status: Every Day   Substances: Nicotine, Flavoring  Substance and Sexual Activity   Alcohol use: Yes    Comment: rarely   Drug use: Not Currently    Types: Marijuana   Sexual activity: Yes  Other Topics Concern   Not on file  Social History Narrative   Not on file   Social Drivers of Health    Financial Resource Strain: Medium Risk (10/03/2023)   Overall Financial Resource Strain (CARDIA)    Difficulty of Paying Living Expenses: Somewhat hard  Food Insecurity: No Food Insecurity (10/03/2023)   Hunger Vital Sign    Worried About Running Out of Food in the Last Year: Never true    Ran Out of Food in the Last Year: Never true  Transportation Needs: No Transportation Needs (10/03/2023)   PRAPARE - Administrator, Civil Service (Medical): No    Lack of Transportation (Non-Medical): No  Physical Activity: Inactive (10/03/2023)   Exercise Vital Sign    Days of Exercise per Week: 0 days    Minutes of Exercise per Session: 0 min  Stress: Stress Concern Present (10/03/2023)   [employer] of Occupational Health - Occupational Stress Questionnaire    Feeling of Stress : Very much  Social Connections: Unknown (10/03/2023)   Social Connection and Isolation Panel    Frequency of Communication with Friends and Family: More than three times a week    Frequency of Social Gatherings with Friends  and Family: More than three times a week    Attends Religious Services: Patient declined    Active Member of Clubs or Organizations: Yes    Attends Banker Meetings: More than 4 times per year    Marital Status: Married    Allergies:  Allergies  Allergen Reactions   Octacosanol    Octacosanol    Hydrocodone Rash    Metabolic Disorder Labs: Lab Results  Component Value Date   HGBA1C 5.5 04/03/2024   MPG 100 10/03/2023   MPG 108 01/17/2023   No results found for: PROLACTIN Lab Results  Component Value Date   CHOL 163 10/03/2023   TRIG 104 10/03/2023   HDL 33 (L) 10/03/2023   CHOLHDL 4.9 10/03/2023   VLDL 18 02/07/2014   LDLCALC 109 (H) 10/03/2023   LDLCALC 123 (H) 01/17/2023   Lab Results  Component Value Date   TSH 1.21 10/03/2023   TSH 1.15 01/17/2023    Therapeutic Level Labs: No results found for: LITHIUM No results found for:  VALPROATE No results found for: CBMZ  Current Medications: Current Outpatient Medications  Medication Sig Dispense Refill   venlafaxine XR (EFFEXOR-XR) 37.5 MG 24 hr capsule Take 1 capsule (37.5 mg total) by mouth daily with breakfast. 30 capsule 1   ALPRAZolam  (NIRAVAM ) 0.25 MG dissolvable tablet Take 1 tablet (0.25 mg total) by mouth at bedtime as needed for anxiety. 30 tablet 0   celecoxib  (CELEBREX ) 100 MG capsule Take 1 capsule (100 mg total) by mouth 2 (two) times daily. 60 capsule 0   diclofenac  (VOLTAREN ) 75 MG EC tablet Take 1 tablet (75 mg total) by mouth 2 (two) times daily. 50 tablet 2   escitalopram  (LEXAPRO ) 5 MG tablet Take 0.5 tablets (2.5 mg total) by mouth daily with breakfast for 6 days. Weaning off     No current facility-administered medications for this visit.     Musculoskeletal: Strength & Muscle Tone: UTA Gait & Station: Seated Patient leans: N/A  Psychiatric Specialty Exam: Review of Systems  Psychiatric/Behavioral:  Positive for decreased concentration. The patient is nervous/anxious.     There were no vitals taken for this visit.There is no height or weight on file to calculate BMI.  General Appearance: Casual  Eye Contact:  Fair  Speech:  Clear and Coherent  Volume:  Normal  Mood:  Anxious  Affect:  Congruent  Thought Process:  Goal Directed and Descriptions of Associations: Intact  Orientation:  Full (Time, Place, and Person)  Thought Content: Logical   Suicidal Thoughts:  No  Homicidal Thoughts:  No  Memory:  Immediate;   Fair Recent;   Fair Remote;   Fair  Judgement:  Fair  Insight:  Fair  Psychomotor Activity:  Normal  Concentration:  Concentration: Fair and Attention Span: Fair  Recall:  [employer] of Knowledge: Fair  Language: Fair  Akathisia:  No  Handed:  Right  AIMS (if indicated): not done  Assets:  Communication Skills Desire for Improvement Housing Social Support  ADL's:  Intact  Cognition: WNL  Sleep:  Fair    Screenings: GAD-7    Garment/textile technologist Visit from 04/03/2024 in [facility] Office Visit from 02/13/2024 in [facility] Visit from 10/03/2023 in [facility] Visit from 05/15/2023 in [facility] Office Visit from 01/17/2023 in [facility]  Total GAD-7 Score 21 17 13 15 16    [phone]  Flowsheet Row Office Visit from 04/03/2024 in [facility] Office Visit from 02/13/2024 in [facility] Office Visit from 10/03/2023 in [facility] Office Visit from 05/15/2023 in [facility] Office Visit from 01/17/2023 in [facility]  PHQ-2 Total Score 0 3 4 5 4   PHQ-9 Total Score 0 16 17 15 18    Flowsheet Row Video Visit from 06/26/2024 in [facility] Video Visit from 04/23/2024 in [facility] Video Visit from 03/03/2024 in [facility]  C-SSRS RISK CATEGORY No Risk No Risk No Risk     Assessment and Plan: Juan Gonzales is a 28 year old Caucasian male who has a history of anxiety, attention and focus problems nicotine use disorder was evaluated by telemedicine today, discussed assessment and plan as noted below.  1. GAD (generalized anxiety disorder)-unstable Ongoing anxiety symptoms currently with side effects to Lexapro .  Multiple SSRI trials failures in the past. Taper of Lexapro  by taking 2.5 mg for 6 to 7 days and stop taking it. Start Effexor XR 37.5 mg once completely off of the Lexapro . Provided medication education. Patient encouraged to establish care with therapist.  2. Attention and concentration deficit-unstable Recently had neuropsychological testing done per Dr. Evalene Riff -pending report.  3. Long term current use of cannabis in remission Currently denies use  4. Nicotine use disorder-improving Currently reports trying to cut back. Will reevaluate in future sessions.  Follow-up Follow-up in clinic in 2 weeks or sooner if needed.    Collaboration of Care: Collaboration of Care: Referral or follow-up with counselor/therapist AEB patient encouraged to establish care with therapist.  Patient to sign an ROI to obtain medical records from recent neuropsychological testing.  Patient/Guardian was advised Release of Information must be obtained prior to any record release in order to collaborate their care with an outside provider. Patient/Guardian was advised if they have not already done so to contact the registration department to sign all necessary forms in order for us  to release information regarding their care.   Consent: Patient/Guardian gives verbal consent for treatment and assignment of benefits for services provided during this visit. Patient/Guardian expressed understanding and agreed to proceed.   This note was generated in part or whole with voice recognition software. Voice recognition is usually quite accurate but there are transcription errors that can and very often do occur. I apologize for any typographical errors that were not detected and corrected.    Myliah Medel, MD 06/26/2024, 10:00 AM

## 2024-06-27 NOTE — Progress Notes (Signed)
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 symptoms persist following improvements in mental health, that would support a full ADHD diagnosis.   A medication (stimulant) trial could be appropriate given the subtle indications on testing and overall symptom endorsements/history, but I defer to the prescribing physician in this respect. However, some caution is warranted. I worry about risk of exacerbating anxiety from stimulant medication side effects given symptoms and sources of the patient's anxiety. The patient's descriptions of responses to antidepressant medication involving considerable variability (sedative/stimulating) are potentially atypical, but my knowledge is limited in this respect. I defer to the prescribing physician regarding all medications in general, but highlight this data piece out of an abundance of caution, but just in case it should be informative in evaluating risk of reaction to stimulant medication (or if it could reflect other unidentified psychopathology). Overall, I would recommend continuing to work with psychiatric and also begin working with an individual therapist to better manage psychiatric  symptoms. If ADHD related symptoms persist despite improvements in mental health, that would be diagnostically meaningful. More practically, many of the struggles/areas of distress described by the patient, ones impacting functioning, would likely benefit from the types interventions delivered in psychotherapy (in conjunction with psychotropic medication). Furthermore, if ADHD is present and responds to  treatment, I suspect benefits would be muted if psychiatric symptoms remain at current levels. It may also be the case that ADHD related symptoms resolve upon control of psychiatric symptoms.   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  There are some indications suggestive of ADHD, but clarity is limited by current psychiatric symptom severity. A medication trial (stimulant) could be trialed, albeit with a good degree of caution. Treating psychiatric symptoms could provide diagnostic clarity, and is recommended irrespective of benefit to differential given likely benefit on overall mental health.  Treating psychiatric symptoms through addition of individual therapy is recommended. The patient may wish to meet with therapists recommended by the referring provider. If availability is limited or the patient wishes to find someone on their own, [email or web address] has a large database of providers that can be filtered by multiple criteria.  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 The patient has been abstaining from use. There may be ongoing improvements as the patient gradually returns to baseline over several months given duration of use.  Sleep quality can have a significant impact on cognitive functioning as well as environmental stressors. I encourage the patient to  be sure they, as best as able, allocate some time for self care and stress relief. I've also included a hand-out regarding sleep hygiene that may be helpful.  Please don't hesitate to reach out with any questions or concerns after receipt of this report. If desired, I can conduct re-evaluation in the future. It was a pleasure meeting with the patient.     Evalene DOROTHA Riff, PsyD Neuropsychologist  This report was generated using voice recognition software. While this document has been carefully reviewed, transcription errors may be present. I apologize in advance for any inconvenience. Please contact me if further clarification is needed.

## 2024-06-27 NOTE — Progress Notes (Signed)
   NEUROPSYCHOLOGICAL EVALUATION [place]. [facility]  Physical Medicine and Rehabilitation     Patient: Juan Gonzales  MRN: [id number] DOB: Jun 22, 1996   Service Provider/Clinical Neuropsychologist: Evalene DOROTHA Riff, PsyD  Date of Service: 05/01/24 Start Time: 2 PM End Time: 3 PM  Location of Service:  [facility] Physical Medicine & Rehabilitation Department [place]. [facility] [address] [place] [place], [place] [address] Phone: [phone]   Billing Code/Service: [phone]    Individuals present: Patient, Provider Laurier DOROTHA Riff, PsyD)  Provider conducted the 60-minute interactive feedback appointment in-person with the patient.  The provider reviewed and discussed the results of neuropsychological evaluation. Follow-up interviewing was conducted as needed to refine interpretation of findings as needed. Review of results included overall findings, diagnosis, and treatment planning/recommendations that were derived from integration of patient data, interpretation of standardized rest results and clinical data, and clinical decision making, which are documented in the patient's electronic medical record with the full report (date listed below). A copy of the full report will also be mailed to the patient.   The patient expressed understanding of the information reviewed. The patient was provided opportunity to ask questions which were then answered by the provider. The provider worked collaboratively to tailor treatment recommendations to the patient when possible. The patient was informed they could reach out to the provider should additional questions related to the evaluation arise.    The final neuropsychological evaluation report, documented in the patient's chart (DATE 04/28/24), was amended to reflect any additional information obtained during the feedback appointment including treatment planning collaboration.               Evalene DOROTHA Riff,  PsyD             Neuropsychologist   This report was generated using voice recognition software. While this document has been carefully reviewed, transcription errors may be present. I apologize in advance for any inconvenience. Please contact me if further clarification is needed.

## 2024-07-10 ENCOUNTER — Encounter: Payer: Self-pay | Admitting: Psychiatry

## 2024-07-10 ENCOUNTER — Telehealth (INDEPENDENT_AMBULATORY_CARE_PROVIDER_SITE_OTHER): Admitting: Psychiatry

## 2024-07-10 DIAGNOSIS — F17200 Nicotine dependence, unspecified, uncomplicated: Secondary | ICD-10-CM

## 2024-07-10 DIAGNOSIS — F1290 Cannabis use, unspecified, uncomplicated: Secondary | ICD-10-CM

## 2024-07-10 DIAGNOSIS — F411 Generalized anxiety disorder: Secondary | ICD-10-CM | POA: Diagnosis not present

## 2024-07-10 DIAGNOSIS — F908 Attention-deficit hyperactivity disorder, other type: Secondary | ICD-10-CM

## 2024-07-10 DIAGNOSIS — F410 Panic disorder [episodic paroxysmal anxiety] without agoraphobia: Secondary | ICD-10-CM | POA: Diagnosis not present

## 2024-07-10 MED ORDER — BUSPIRONE HCL 5 MG PO TABS: 5.0000 mg | ORAL_TABLET | Freq: Two times a day (BID) | ORAL | 0 refills | Status: DC

## 2024-07-10 NOTE — Patient Instructions (Signed)
 Buspirone  Tablets What is this medication? BUSPIRONE  (byoo SPYE rone) treats anxiety. It works by balancing the levels of dopamine and serotonin in your brain, substances that help regulate mood. This medicine may be used for other purposes; ask your health care provider or pharmacist if you have questions. COMMON BRAND NAME(S): BuSpar , Buspar  Dividose What should I tell my care team before I take this medication? They need to know if you have any of these conditions: Kidney disease Liver disease An unusual or allergic reaction to buspirone , other medications, foods, dyes, or preservatives Pregnant or trying to get pregnant Breastfeeding How should I use this medication? Take this medication by mouth with water . Take it as directed on the prescription label. You can take it with or without food. You should always take it the same way. Keep taking it unless your care team tells you to stop. Do not take this medication with foods or drinks that contain grapefruit. Talk to your care team about the use of this medication in children. Special care may be needed. Overdosage: If you think you have taken too much of this medicine contact a poison control center or emergency room at once. NOTE: This medicine is only for you. Do not share this medicine with others. What if I miss a dose? If you miss a dose, take it as soon as you can. If it is almost time for your next dose, take only that dose. Do not take double or extra doses. What may interact with this medication? Do not take this medication with any of the following: Linezolid MAOIs like Carbex, Eldepryl, Marplan, Nardil, and Parnate Methylene blue Procarbazine This medication may also interact with the following: Alcohol Diazepam Digoxin Droperidol Grapefruit juice Haloperidol Metoclopramide Opioids Phenothiazines, such as chlorpromazine, prochlorperazine, thioridazine Some medications for depression, anxiety, or other mental health  conditions Some medication for migraines, such as sumatriptan Stimulant medications for ADHD, weight loss, or staying awake Supplements, such as St. John's wort or tryptophan Tetrabenazine Other medications may affect the way this medication works. Talk with your care team about all the medications you take. They may suggest changes to your treatment plan to lower the risk of side effects and to make sure your medications work as intended. This list may not describe all possible interactions. Give your health care provider a list of all the medicines, herbs, non-prescription drugs, or dietary supplements you use. Also tell them if you smoke, drink alcohol, or use illegal drugs. Some items may interact with your medicine. What should I watch for while using this medication? Visit your care team for regular checks on your progress. It may take 1 to 2 weeks before your anxiety gets better. This medication may affect your coordination, reaction time, or judgment. Do not drive or operate machinery until you know how this medication affects you. Sit up or stand slowly to reduce the risk of dizzy or fainting spells. Drinking alcohol with this medication can increase the risk of these side effects. Serotonin syndrome is when your body has too much serotonin in it. This happens when this medication is used with other ones that increase serotonin levels. Common medications that increase serotonin levels are antidepressants, some medications for migraines, and some antibiotics. The symptoms of serotonin syndrome include irritability, confusion, fast or irregular heartbeat, muscle stiffness, twitching muscles, sweating, high fever, seizure, chills, vomiting and diarrhea. Contact your care team right away if you think you have serotonin syndrome. Talk to your care team about this medication if  you are breastfeeding. There are benefits and risks to taking medications while breastfeeding. Your care team can help you  find the option that works for you. What side effects may I notice from receiving this medication? Side effects that you should report to your care team as soon as possible: Allergic reactions--skin rash, itching, hives, swelling of the face, lips, tongue, or throat Irritability, confusion, fast or irregular heartbeat, muscle stiffness, twitching muscles, sweating, high fever, seizure, chills, vomiting, diarrhea, which may be signs of serotonin syndrome Side effects that usually do not require medical attention (report to your care team if they continue or are bothersome): Anxiety, nervousness Dizziness Drowsiness Headache Nausea Trouble sleeping This list may not describe all possible side effects. Call your doctor for medical advice about side effects. You may report side effects to FDA at 1-800-FDA-1088. Where should I keep my medication? Keep out of reach of children and pets. Store at room temperature between 20 and 25 degrees C (68 and 77 degrees F). Get rid of any unused medication after the expiration date. To get rid of medications that are no longer needed or expired: Take the medication to a take-back program. Check with your pharmacy or law enforcement to find a location. If you cannot return the medication, check the label or package insert to see if the medication should be thrown out in the garbage or flushed down the toilet. If you are not sure, ask your care team. If it is safe to put it in the trash, empty the medication out of the container. Mix it with cat litter, dirt, coffee grounds, or another unwanted substance. Seal the mixture in a bag or container. Put it in the trash. NOTE: This sheet is a summary. It may not cover all possible information. If you have questions about this medicine, talk to your doctor, pharmacist, or health care provider.  2025 [employer]vier/Gold Standard (2024-01-28 00:00:00)

## 2024-07-10 NOTE — Progress Notes (Signed)
 Virtual Visit via Video Note  I connected with Juan Gonzales on 07/10/24 at 10:00 AM EST by a video enabled telemedicine application and verified that I am speaking with the correct person using two identifiers.  Location Provider Location : [facility] Patient Location : Home  Participants: Patient , Provider    I discussed the limitations of evaluation and management by telemedicine and the availability of in person appointments. The patient expressed understanding and agreed to proceed.   I discussed the assessment and treatment plan with the patient. The patient was provided an opportunity to ask questions and all were answered. The patient agreed with the plan and demonstrated an understanding of the instructions.   The patient was advised to call back or seek an in-person evaluation if the symptoms worsen or if the condition fails to improve as anticipated.   BH MD OP Progress Note  07/10/2024 3:44 PM Juan Gonzales  MRN:  [id number]  Chief Complaint:  Chief Complaint  Patient presents with   Follow-up   Depression   Anxiety   Medication Refill   Discussed the use of AI scribe software for clinical note transcription with the patient, who gave verbal consent to proceed.  History of Present Illness Juan Gonzales is a 28 year old Caucasian male, married, employed, lives in [place], has a history of GAD, long-term use of cannabis, attention and focus problems, diabetes mellitus type 2, IBS was evaluated by telemedicine today.  He continues to experience ongoing anxiety, with increased nervousness about starting the new medication Effexor prescribed last visit due to concerns about side effects and withdrawal, especially after hearing about negative experiences from friends and reading information online. He notes that his anxiety tends to worsen around midday and at night, while mornings remain less affected. He expresses a need for help managing his anxiety and describes  previous minimal experience with the medication during a period of severe anxiety but without giving it a full trial.  After he stopped Lexapro , he experienced a single panic attack, having taken his last dose last Friday. To manage the panic attack, he used a quarter of a 0.25 mg alprazolam  tablet, which he found effective. He does not currently feel depressed, though he experienced transient sadness and irritability while discontinuing Lexapro , which he relates to withdrawal. Since he stopped Lexapro , he reports improvement in mood and sleep, with better sleep quality except for occasional late nights spent playing video games.  He previously tried Buspar for anxiety, but only took it once during a period of significant anxiety and did not give it a full trial.  He is interested in a trial again.  He reports past cannabis use, stating he stopped using cannabis few months ago.  He denies any suicidality, homicidality or perceptual disturbances.    Visit Diagnosis:    ICD-10-CM   1. GAD (generalized anxiety disorder)  F41.1 busPIRone (BUSPAR) 5 MG tablet    2. Panic disorder  F41.0     3. Other specified attention deficit hyperactivity disorder (ADHD)  F90.8    with insufficient symptoms    4. Long term current use of cannabis  F12.90     5. Nicotine use disorder  F17.200    Mild      Past Psychiatric History: I have reviewed past psychiatric history from progress note on 02/13/2024.  Past trials of medications like Zoloft , Prozac, BuSpar, Lexapro .  Past Medical History:  Past Medical History:  Diagnosis Date   Abdominal pain  Anxiety    Diabetes mellitus without complication (HCC)    type 2   Diarrhea    IBS (irritable bowel syndrome)    presumptive    Past Surgical History:  Procedure Laterality Date   ESOPHAGOGASTRODUODENOSCOPY (EGD) WITH PROPOFOL  N/A 01/30/2023   Procedure: ESOPHAGOGASTRODUODENOSCOPY (EGD) WITH PROPOFOL ;  Surgeon: Toledo, Ladell POUR, MD;  Location: [facility]  ENDOSCOPY;  Service: Gastroenterology;  Laterality: N/A;   TONSILLECTOMY     WISDOM TOOTH EXTRACTION      Family Psychiatric History: I have reviewed family psychiatric history from progress note on 02/13/2024.  Family History:  Family History  Problem Relation Age of Onset   Hypertension Father    ADD / ADHD Brother    Autism spectrum disorder Brother    Suicidality Maternal Aunt     Social History: I have reviewed social history from progress note on 02/13/2024. Social History   Socioeconomic History   Marital status: Married    Spouse name: Not on file   Number of children: 1   Years of education: Not on file   Highest education level: Associate degree: occupational, scientist, product/process development, or vocational program  Occupational History   Occupation: production designer, theatre/television/film    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  Tobacco Use   Smoking status: Former    Current packs/day: 0.00    Types: Cigarettes    Quit date: 03/24/2018    Years since quitting: 6.3    Passive exposure: Never   Smokeless tobacco: Never  Vaping Use   Vaping status: Every Day   Substances: Nicotine, Flavoring  Substance and Sexual Activity   Alcohol use: Yes    Comment: rarely   Drug use: Not Currently    Types: Marijuana   Sexual activity: Yes  Other Topics Concern   Not on file  Social History Narrative   Not on file   Social Drivers of Health   Financial Resource Strain: Medium Risk (10/03/2023)   Overall Financial Resource Strain (CARDIA)    Difficulty of Paying Living Expenses: Somewhat hard  Food Insecurity: No Food Insecurity (10/03/2023)   Hunger Vital Sign    Worried About Running Out of Food in the Last Year: Never true    Ran Out of Food in the Last Year: Never true  Transportation Needs: No Transportation Needs (10/03/2023)   PRAPARE - Administrator, Civil Service (Medical): No    Lack of Transportation (Non-Medical): No  Physical Activity: Inactive (10/03/2023)   Exercise Vital Sign    Days of Exercise per Week: 0  days    Minutes of Exercise per Session: 0 min  Stress: Stress Concern Present (10/03/2023)   [employer] of Occupational Health - Occupational Stress Questionnaire    Feeling of Stress : Very much  Social Connections: Unknown (10/03/2023)   Social Connection and Isolation Panel    Frequency of Communication with Friends and Family: More than three times a week    Frequency of Social Gatherings with Friends and Family: More than three times a week    Attends Religious Services: Patient declined    Database Administrator or Organizations: Yes    Attends Engineer, Structural: More than 4 times per year    Marital Status: Married    Allergies:  Allergies  Allergen Reactions   Octacosanol    Octacosanol    Hydrocodone Rash    Metabolic Disorder Labs: Lab Results  Component Value Date   HGBA1C 5.5 04/03/2024   MPG 100 10/03/2023   MPG  108 01/17/2023   No results found for: PROLACTIN Lab Results  Component Value Date   CHOL 163 10/03/2023   TRIG 104 10/03/2023   HDL 33 (L) 10/03/2023   CHOLHDL 4.9 10/03/2023   VLDL 18 02/07/2014   LDLCALC 109 (H) 10/03/2023   LDLCALC 123 (H) 01/17/2023   Lab Results  Component Value Date   TSH 1.21 10/03/2023   TSH 1.15 01/17/2023    Therapeutic Level Labs: No results found for: LITHIUM No results found for: VALPROATE No results found for: CBMZ  Current Medications: Current Outpatient Medications  Medication Sig Dispense Refill   busPIRone (BUSPAR) 5 MG tablet Take 1 tablet (5 mg total) by mouth 2 (two) times daily. 60 tablet 0   ALPRAZolam  (NIRAVAM ) 0.25 MG dissolvable tablet Take 1 tablet (0.25 mg total) by mouth at bedtime as needed for anxiety. 30 tablet 0   celecoxib  (CELEBREX ) 100 MG capsule Take 1 capsule (100 mg total) by mouth 2 (two) times daily. 60 capsule 0   diclofenac  (VOLTAREN ) 75 MG EC tablet Take 1 tablet (75 mg total) by mouth 2 (two) times daily. 50 tablet 2   No current  facility-administered medications for this visit.     Musculoskeletal: Strength & Muscle Tone: UTA Gait & Station: Seated Patient leans: N/A  Psychiatric Specialty Exam: Review of Systems  Psychiatric/Behavioral:  Positive for decreased concentration. The patient is nervous/anxious.     There were no vitals taken for this visit.There is no height or weight on file to calculate BMI.  General Appearance: Casual  Eye Contact:  Fair  Speech:  Clear and Coherent  Volume:  Normal  Mood:  Anxious  Affect:  Appropriate  Thought Process:  Goal Directed and Descriptions of Associations: Intact  Orientation:  Full (Time, Place, and Person)  Thought Content: Logical   Suicidal Thoughts:  No  Homicidal Thoughts:  No  Memory:  Immediate;   Fair Recent;   Fair Remote;   Fair  Judgement:  Fair  Insight:  Fair  Psychomotor Activity:  Normal  Concentration:  Concentration: Good and Attention Span: Fair  Recall:  Good  Fund of Knowledge: Fair  Language: Fair  Akathisia:  No  Handed:  Right  AIMS (if indicated): not done  Assets:  Communication Skills Desire for Improvement Social Support Transportation  ADL's:  Intact  Cognition: WNL  Sleep:  Fair   Screenings: GAD-7    Garment/textile Technologist Visit from 04/03/2024 in [facility] Office Visit from 02/13/2024 in [facility] Office Visit from 10/03/2023 in [facility] Office Visit from 05/15/2023 in [facility] Office Visit from 01/17/2023 in [facility]  Total GAD-7 Score 21 17 13 15 16    PHQ2-9    Flowsheet Row Office Visit from 04/03/2024 in [facility] Office Visit from 02/13/2024 in [facility] Office Visit from 10/03/2023 in [facility] Office Visit from 05/15/2023 in [facility] Office Visit from 01/17/2023 in [facility]  PHQ-2 Total Score 0 3 4 5 4   PHQ-9 Total Score 0 16 17 15 18    Flowsheet Row Video Visit from 07/10/2024 in [facility] Video Visit from 06/26/2024 in [facility] Video Visit from 04/23/2024 in [facility]  C-SSRS RISK CATEGORY No Risk No Risk  No Risk     Assessment and Plan: KAUSHAL VANNICE is a 28 year old Caucasian male who presented for a follow-up appointment, discussed assessment and plan as noted below.  1. GAD (generalized anxiety disorder)-unstable Ongoing anxiety although with some improvement.  Denies any current withdrawal symptoms from Lexapro .  Did not take the Effexor which was prescribed last visit since worried about side effects.  Interested in trial of BuSpar. Start BuSpar 5 mg twice daily Encouraged to establish care with therapist  2. Panic disorder-improving Reports 1 panic attack in the last week.  Encouraged to establish care with therapist. Start BuSpar 5 mg twice daily Does use limited amount of Xanax  which he uses as needed prescribed by primary care provider. Will need urine drug screen prior to taking over this prescription.  3. Other specified attention deficit hyperactivity disorder (ADHD)-unstable Recent neuropsychological testing per Dr. Evalene Riff.  Summary indications suggestive of ADHD although clarity is limited by current psychiatric symptom severity.  A medication trial(stimulant) could be trialed albeit with a good degree of caution.  Treating psychiatric symptoms could provide diagnostic clarity and is recommended irrespective of benefit to differential given likely benefit on overall mental health. Will consider trial once mood symptoms stable.  4. Long term current use of cannabis Currently in early remission. Reports he stopped using couple of  months ago.  Will consider getting a urine drug screen.  5. Nicotine use disorder - unstable Will monitor closely. Reevaluate in future sessions.   Follow-up Follow-up in clinic in 3 to 4 weeks or sooner if needed.  Collaboration of Care: Collaboration of Care: Referral or follow-up with counselor/therapist AEB patient encouraged to start psychotherapy sessions.  Patient/Guardian was advised Release of Information must be obtained prior to any record release in order to collaborate their care with an outside provider. Patient/Guardian was advised if they have not already done so to contact the registration department to sign all necessary forms in order for us  to release information regarding their care.   Consent: Patient/Guardian gives verbal consent for treatment and assignment of benefits for services provided during this visit. Patient/Guardian expressed understanding and agreed to proceed.  This note was generated in part or whole with voice recognition software. Voice recognition is usually quite accurate but there are transcription errors that can and very often do occur. I apologize for any typographical errors that were not detected and corrected.     Kahlia Lagunes, MD 07/10/2024, 3:44 PM

## 2024-07-22 ENCOUNTER — Encounter: Payer: Self-pay | Admitting: Nurse Practitioner

## 2024-07-22 DIAGNOSIS — E1165 Type 2 diabetes mellitus with hyperglycemia: Secondary | ICD-10-CM

## 2024-07-29 ENCOUNTER — Telehealth: Admitting: Psychiatry

## 2024-08-05 ENCOUNTER — Encounter: Payer: Self-pay | Admitting: Nurse Practitioner

## 2024-08-07 ENCOUNTER — Other Ambulatory Visit: Payer: Self-pay | Admitting: Psychiatry

## 2024-08-07 DIAGNOSIS — F411 Generalized anxiety disorder: Secondary | ICD-10-CM

## 2024-08-14 DIAGNOSIS — Z419 Encounter for procedure for purposes other than remedying health state, unspecified: Secondary | ICD-10-CM | POA: Diagnosis not present

## 2024-08-20 ENCOUNTER — Ambulatory Visit: Payer: Self-pay | Admitting: Podiatry

## 2024-09-24 ENCOUNTER — Ambulatory Visit: Admitting: Podiatry

## 2024-10-01 ENCOUNTER — Ambulatory Visit: Admitting: Podiatry

## 2024-10-01 ENCOUNTER — Ambulatory Visit

## 2024-10-01 DIAGNOSIS — M216X2 Other acquired deformities of left foot: Secondary | ICD-10-CM | POA: Diagnosis not present

## 2024-10-01 DIAGNOSIS — M216X1 Other acquired deformities of right foot: Secondary | ICD-10-CM

## 2024-10-01 DIAGNOSIS — M7671 Peroneal tendinitis, right leg: Secondary | ICD-10-CM

## 2024-10-01 MED ORDER — TRIAMCINOLONE ACETONIDE 10 MG/ML IJ SUSP
10.0000 mg | Freq: Once | INTRAMUSCULAR | Status: AC
  Administered 2024-10-01: 10 mg via INTRA_ARTICULAR

## 2024-10-02 NOTE — Progress Notes (Signed)
 Subjective:   Patient ID: Juan Gonzales, male   DOB: 29 y.o.   MRN: [id number]   HPI Patient presents stating he has started to develop a lot of pain in his right lateral foot again and states that has been very sore and he needs to be active and it keeps him from doing this.  Patient states it seemed to do well for a while his boot that we had given him was destroyed by his dog and he has not been able to use   ROS      Objective:  Physical Exam  Neurovascular status intact inflammation around the peroneal tendon right as it inserts into the base of the fifth metatarsal with no indication of tendon dysfunction.  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  We may need to get an MRI and I discussed that with him he does have internal and external type face metal which would have to be removed that he can do if needed but at this time organ to try conservative again and I went ahead I carefully did sterile prep carefully after explaining risk injected the sheath of the tendon 3 mg dexamethasone Kenalog  5 mg Xylocaine and I have patient getting his own air fracture walker for future.  I then casted for functional orthotic devices to offload weight off the peroneal tendon with valgus wedging to occur.  Reappoint 4 weeks or earlier if needed and will require MRI if symptoms persist through this conservative treatment plan

## 2024-10-08 ENCOUNTER — Ambulatory Visit: Admitting: Nurse Practitioner

## 2024-10-08 ENCOUNTER — Encounter: Payer: Self-pay | Admitting: Nurse Practitioner

## 2024-10-08 ENCOUNTER — Telehealth: Payer: Self-pay

## 2024-10-08 VITALS — BP 102/70 | HR 73 | Temp 98.2°F | Ht 73.0 in | Wt 229.0 lb

## 2024-10-08 DIAGNOSIS — L299 Pruritus, unspecified: Secondary | ICD-10-CM

## 2024-10-08 DIAGNOSIS — F411 Generalized anxiety disorder: Secondary | ICD-10-CM

## 2024-10-08 DIAGNOSIS — F908 Attention-deficit hyperactivity disorder, other type: Secondary | ICD-10-CM

## 2024-10-08 DIAGNOSIS — E1165 Type 2 diabetes mellitus with hyperglycemia: Secondary | ICD-10-CM | POA: Insufficient documentation

## 2024-10-08 DIAGNOSIS — Z130 Encounter for screening for diseases of the blood and blood-forming organs and certain disorders involving the immune mechanism: Secondary | ICD-10-CM

## 2024-10-08 DIAGNOSIS — Z0001 Encounter for general adult medical examination with abnormal findings: Secondary | ICD-10-CM

## 2024-10-08 DIAGNOSIS — Z13220 Encounter for screening for lipoid disorders: Secondary | ICD-10-CM

## 2024-10-08 DIAGNOSIS — F331 Major depressive disorder, recurrent, moderate: Secondary | ICD-10-CM

## 2024-10-08 DIAGNOSIS — E782 Mixed hyperlipidemia: Secondary | ICD-10-CM

## 2024-10-08 MED ORDER — HYDROXYZINE PAMOATE 25 MG PO CAPS: 25.0000 mg | ORAL_CAPSULE | Freq: Every evening | ORAL | 0 refills | Status: AC | PRN

## 2024-10-08 MED ORDER — ALPRAZOLAM 0.25 MG PO TBDP: 0.2500 mg | ORAL_TABLET | Freq: Every evening | ORAL | 0 refills | Status: AC | PRN

## 2024-10-08 NOTE — Telephone Encounter (Signed)
 Behavioral Health referral paperwork faxed. Placed in basket to scan into chart.

## 2024-10-08 NOTE — Progress Notes (Signed)
 Name: Juan Gonzales   MRN: [id number]    DOB: August 06, 1996   Date:10/08/2024       Progress Note  Subjective  Chief Complaint  Chief Complaint  Patient presents with   Annual Exam    Pt would like to discuss itchy legs x3 months and a dark spot on skin located on right lower leg.    referral setup    Would like to discuss switching psychiatrist.     HPI  Patient presents for annual CPE . Discussed the use of AI scribe software for clinical note transcription with the patient, who gave verbal consent to proceed.  History of Present Illness Juan Gonzales is a 29 year old male who presents for an annual physical exam.  Pruritus - Pruritus of the legs for the past three months, primarily at bedtime - No new soaps or lotions used - No rashes present - No prior use of hydroxyzine  - Antihistamines such as diphenhydramine cause hyperactivity  Psychiatric symptoms and medication concerns - Depression, ADHD, and anxiety under psychiatric care - Dissatisfaction with current psychiatrist, feeling not listened to - Concerned about being prescribed venlafaxine  due to potential withdrawal symptoms - Uses alprazolam  0.25 mg as needed; two pills remaining from last refill  Type 2 diabetes mellitus - Not currently on diabetic medication - Last HbA1c in August was 5.5% - Recent blood glucose reading of 210 mg/dL, attributed to steroid injection in foot - Concern about poor diet and uncertainty regarding appropriate food choices - Anxious about upcoming HbA1c results  Hyperlipidemia - History of hyperlipidemia - Most recent LDL was 109 mg/dL  Physical activity and sleep - Approximately seven hours of sleep per night - Desires to increase physical activity - Considering joining a gym through [facility], requires form from physician - No recent exercise  Constipation - Constipation present - Inadequate water intake      Diet: reports not eating well balanced diet, recent steroid  injection in foot Exercise: not currently,  recommend 150 min of physical activity weekly   Sleep: 7 hours ish Last dental exam:3 months ago Last eye exam: needs to schedule  Depression: phq 9 is positive    10/08/2024    9:06 AM 04/03/2024    9:40 AM 02/13/2024    2:26 PM 10/03/2023    9:07 AM 05/15/2023    1:50 PM  Depression screen PHQ 2/9  Decreased Interest 0 0  2 2  Down, Depressed, Hopeless 1 0  2 3  PHQ - 2 Score 1 0  4 5  Altered sleeping 1 0  3 3  Tired, decreased energy 2 0  3 3  Change in appetite 2 0  2 0  Feeling bad or failure about yourself  1 0  2 1  Trouble concentrating 2 0  3 3  Moving slowly or fidgety/restless 1 0  0 0  Suicidal thoughts 0 0  0 0  PHQ-9 Score 10 0   17  15   Difficult doing work/chores Somewhat difficult Not difficult at all  Somewhat difficult      Information is confidential and restricted. Go to Review Flowsheets to unlock data.   Data saved with a previous flowsheet row definition    Hypertension:  BP Readings from Last 3 Encounters:  10/08/24 102/70  04/03/24 110/74  10/03/23 116/82    Obesity: Wt Readings from Last 3 Encounters:  10/08/24 229 lb (103.9 kg)  04/03/24 220 lb 14.4 oz (100.2 kg)  10/03/23  212 lb 6.4 oz (96.3 kg)   BMI Readings from Last 3 Encounters:  10/08/24 30.21 kg/m  04/03/24 30.81 kg/m  10/03/23 29.62 kg/m     Lipids:  Lab Results  Component Value Date   CHOL 163 10/03/2023   CHOL 177 01/17/2023   CHOL 116 02/07/2014   Lab Results  Component Value Date   HDL 33 (L) 10/03/2023   HDL 32 (L) 01/17/2023   HDL 22 (L) 02/07/2014   Lab Results  Component Value Date   LDLCALC 109 (H) 10/03/2023   LDLCALC 123 (H) 01/17/2023   LDLCALC 76 02/07/2014   Lab Results  Component Value Date   TRIG 104 10/03/2023   TRIG 111 01/17/2023   TRIG 89 02/07/2014   Lab Results  Component Value Date   CHOLHDL 4.9 10/03/2023   CHOLHDL 5.5 (H) 01/17/2023   No results found for: LDLDIRECT Glucose:   Glucose  Date Value Ref Range Status  02/07/2014 68 65 - 99 mg/dL Final  93/93/7984 91 65 - 99 mg/dL Final   Glucose, Bld  Date Value Ref Range Status  10/03/2023 111 (H) 65 - 99 mg/dL Final    Comment:    .            Fasting reference interval . For someone without known diabetes, a glucose value between 100 and 125 mg/dL is consistent with prediabetes and should be confirmed with a follow-up test. .   05/15/2023 96 65 - 99 mg/dL Final    Comment:    .            Fasting reference interval .   01/17/2023 101 (H) 65 - 99 mg/dL Final    Comment:    .            Fasting reference interval . For someone without known diabetes, a glucose value between 100 and 125 mg/dL is consistent with prediabetes and should be confirmed with a follow-up test. .     Flowsheet Row Office Visit from 10/08/2024 in [facility]  AUDIT-C Score 0     Married STD testing and prevention (HIV/chl/gon/syphilis): completed Hep C: completed  Skin cancer: Discussed monitoring for atypical lesions Colorectal cancer: does not qualify Prostate cancer: does not qualify No results found for: PSA   Lung cancer:   Low Dose CT Chest recommended if Age 39-80 years, 30 pack-year currently smoking OR have quit w/in 15years. Patient does not qualify.   AAA:  The USPSTF recommends one-time screening with ultrasonography in men ages 61 to 75 years who have ever smoked ECG:  10/03/2023  Vaccines:  HPV: up to at age 82 , ask insurance if age between 53-45  Shingrix: 93-64 yo and ask insurance if covered when patient above 68 yo Pneumonia:  educated and discussed with patient. Flu:  educated and discussed with patient.  Advanced Care Planning: A voluntary discussion about advance care planning including the explanation and discussion of advance directives.  Discussed health care proxy and Living will, and the patient was able to identify a health care proxy as wife.  Patient  does not have a living will at present time. If patient does have living will, I have requested they bring this to the clinic to be scanned in to their chart.  Patient Active Problem List   Diagnosis Date Noted   Type 2 diabetes mellitus with hyperglycemia, without long-term current use of insulin (HCC) 10/08/2024   Other specified attention deficit hyperactivity disorder (  ADHD) 07/10/2024   Panic disorder 07/10/2024   Nicotine use disorder 04/24/2024   Attention and concentration deficit 02/13/2024   Long term current use of cannabis 02/13/2024   Mixed hyperlipidemia 10/03/2023   GAD (generalized anxiety disorder) 01/17/2023   Moderate episode of recurrent major depressive disorder (HCC) 01/17/2023   Irritable bowel syndrome (IBS) 07/09/2011    Past Surgical History:  Procedure Laterality Date   ESOPHAGOGASTRODUODENOSCOPY (EGD) WITH PROPOFOL  N/A 01/30/2023   Procedure: ESOPHAGOGASTRODUODENOSCOPY (EGD) WITH PROPOFOL ;  Surgeon: Toledo, Ladell POUR, MD;  Location: [facility] ENDOSCOPY;  Service: Gastroenterology;  Laterality: N/A;   TONSILLECTOMY     WISDOM TOOTH EXTRACTION      Family History  Problem Relation Age of Onset   Hypertension Father    ADD / ADHD Brother    Autism spectrum disorder Brother    Suicidality Maternal Aunt     Social History   Socioeconomic History   Marital status: Married    Spouse name: Not on file   Number of children: 1   Years of education: Not on file   Highest education level: Associate degree: occupational, scientist, product/process development, or vocational program  Occupational History   Occupation: production designer, theatre/television/film    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  Tobacco Use   Smoking status: Former    Current packs/day: 0.00    Types: Cigarettes    Quit date: 03/24/2018    Years since quitting: 6.5    Passive exposure: Never   Smokeless tobacco: Never  Vaping Use   Vaping status: Every Day   Substances: Nicotine, Flavoring  Substance and Sexual Activity   Alcohol use: Yes    Comment: rarely    Drug use: Not Currently    Types: Marijuana   Sexual activity: Yes  Other Topics Concern   Not on file  Social History Narrative   Not on file   Social Drivers of Health   Tobacco Use: Medium Risk (10/08/2024)   Patient History    Smoking Tobacco Use: Former    Smokeless Tobacco Use: Never    Passive Exposure: Never  Physicist, Medical Strain: Low Risk (10/08/2024)   Overall Financial Resource Strain (CARDIA)    Difficulty of Paying Living Expenses: Not very hard  Food Insecurity: No Food Insecurity (10/08/2024)   Epic    Worried About Radiation Protection Practitioner of Food in the Last Year: Never true    Ran Out of Food in the Last Year: Never true  Transportation Needs: No Transportation Needs (10/08/2024)   Epic    Lack of Transportation (Medical): No    Lack of Transportation (Non-Medical): No  Physical Activity: Inactive (10/08/2024)   Exercise Vital Sign    Days of Exercise per Week: 0 days    Minutes of Exercise per Session: 0 min  Stress: Stress Concern Present (10/03/2023)   [employer] of Occupational Health - Occupational Stress Questionnaire    Feeling of Stress : Very much  Social Connections: Moderately Integrated (10/08/2024)   Social Connection and Isolation Panel    Frequency of Communication with Friends and Family: More than three times a week    Frequency of Social Gatherings with Friends and Family: More than three times a week    Attends Religious Services: Never    Database Administrator or Organizations: No    Attends Engineer, Structural: More than 4 times per year    Marital Status: Married  Catering Manager Violence: Not At Risk (10/08/2024)   Epic    Fear of Current or Ex-Partner:  No    Emotionally Abused: No    Physically Abused: No    Sexually Abused: No  Depression (PHQ2-9): Medium Risk (10/08/2024)   Depression (PHQ2-9)    PHQ-2 Score: 10  Alcohol Screen: Low Risk (10/08/2024)   Alcohol Screen    Last Alcohol Screening Score (AUDIT): 0  Housing: Unknown  (10/08/2024)   Epic    Unable to Pay for Housing in the Last Year: No    Number of Times Moved in the Last Year: Not on file    Homeless in the Last Year: No  Utilities: Not At Risk (10/08/2024)   Epic    Threatened with loss of utilities: No  Health Literacy: Adequate Health Literacy (10/08/2024)   B1300 Health Literacy    Frequency of need for help with medical instructions: Never    Current Medications[1]  Allergies[2]   ROS  Ten systems reviewed and is negative except as mentioned in HPI    Objective  Vitals:   10/08/24 0846  BP: 102/70  Pulse: 73  Temp: 98.2 F (36.8 C)  SpO2: 99%  Weight: 229 lb (103.9 kg)  Height: 6' 1 (1.854 m)    Body mass index is 30.21 kg/m.  Physical Exam Vitals reviewed.  Constitutional:      Appearance: Normal appearance.  HENT:     Head: Normocephalic.     Right Ear: Tympanic membrane normal.     Left Ear: Tympanic membrane normal.     Nose: Nose normal.  Eyes:     Extraocular Movements: Extraocular movements intact.     Conjunctiva/sclera: Conjunctivae normal.     Pupils: Pupils are equal, round, and reactive to light.  Neck:     Thyroid: No thyroid mass, thyromegaly or thyroid tenderness.  Cardiovascular:     Rate and Rhythm: Normal rate and regular rhythm.     Pulses: Normal pulses.     Heart sounds: Normal heart sounds.  Pulmonary:     Effort: Pulmonary effort is normal.     Breath sounds: Normal breath sounds.  Abdominal:     General: Bowel sounds are normal.     Palpations: Abdomen is soft.  Musculoskeletal:        General: Normal range of motion.     Cervical back: Normal range of motion and neck supple.     Right lower leg: No edema.     Left lower leg: No edema.  Skin:    General: Skin is warm and dry.     Capillary Refill: Capillary refill takes less than 2 seconds.  Neurological:     General: No focal deficit present.     Mental Status: He is alert and oriented to person, place, and time. Mental status is  at baseline.  Psychiatric:        Mood and Affect: Mood normal.        Behavior: Behavior normal.        Thought Content: Thought content normal.        Judgment: Judgment normal.    Diabetic Foot Exam - Simple   Simple Foot Form Diabetic Foot exam was performed with the following findings: Yes 10/08/2024  9:00 AM  Visual Inspection No deformities, no ulcerations, no other skin breakdown bilaterally: Yes Sensation Testing Intact to touch and monofilament testing bilaterally: Yes Pulse Check Posterior Tibialis and Dorsalis pulse intact bilaterally: Yes Comments       No results found for this or any previous visit (from the past 2160 hours).  Fall Risk:    10/08/2024    8:48 AM 04/03/2024    9:40 AM 10/03/2023    9:02 AM 05/15/2023    1:49 PM 01/17/2023    2:38 PM  Fall Risk   Falls in the past year? 0 0 0 0 0  Number falls in past yr: 0 0 0 0 0  Injury with Fall? 0 0  0  0  0   Risk for fall due to : No Fall Risks No Fall Risks  No Fall Risks   Follow up Falls evaluation completed Falls evaluation completed Falls evaluation completed Falls prevention discussed      Data saved with a previous flowsheet row definition      Functional Status Survey: Is the patient deaf or have difficulty hearing?: No Does the patient have difficulty seeing, even when wearing glasses/contacts?: No Does the patient have difficulty concentrating, remembering, or making decisions?: No Does the patient have difficulty walking or climbing stairs?: No Does the patient have difficulty dressing or bathing?: No Does the patient have difficulty doing errands alone such as visiting a doctor's office or shopping?: No    Assessment & Plan  Problem List Items Addressed This Visit       Endocrine   Type 2 diabetes mellitus with hyperglycemia, without long-term current use of insulin (HCC)     Other   GAD (generalized anxiety disorder)   Moderate episode of recurrent major depressive disorder  (HCC) - Primary   Mixed hyperlipidemia   Other specified attention deficit hyperactivity disorder (ADHD)   Other Visit Diagnoses       Screening for cholesterol level         Screening for deficiency anemia          Assessment and Plan Assessment & Plan Type 2 diabetes mellitus Recent blood sugar fluctuations, including a reading of 210 mg/dL. Last A1c was 5.5% in August. Concerns about dietary habits and recent steroid injection affecting blood sugar levels. - Ordered diabetic eye exam and foot exam - Ordered microalbumin urine test - Encouraged well-balanced diet and regular physical activity  Major depressive disorder, ADHD, and generalized anxiety disorder Current dissatisfaction with psychiatrist and medication regimen. Reports adverse effects from current medications and reluctance to try Effexor . Uses Alprazolam  0.25 mg as needed for anxiety, with concerns about withdrawal symptoms and brain zaps. - Referred to [facility] for psychiatric evaluation - Prescribed 15-day supply of Alprazolam  0.25 mg  Pruritus Legs, primarily at bedtime, ongoing for several months. No new soaps or lotions used. No visible rashes. Possible exacerbation due to dry winter weather. - Prescribed hydroxyzine  for itching, to be taken at bedtime - Advised to report via MyChart if hydroxyzine  is ineffective, and to consider over-the-counter Zyrtec or Claritin if needed  Mixed hyperlipidemia Previous LDL of 109 mg/dL.  General Health Maintenance Overdue for diabetic eye exam, foot exam, and microalbumin urine test. Difficulty finding a dentist and eye exam provider that accepts . - Ordered diabetic eye exam and foot exam - Ordered microalbumin urine test - Encouraged finding a dentist and eye exam provider that accepts - Recommended 150 minutes of physical activity weekly     -Prostate cancer screening and PSA options (with potential risks and benefits of testing vs not testing) were discussed  along with recent recs/guidelines. -USPSTF grade A and B recommendations reviewed with patient; age-appropriate recommendations, preventive care, screening tests, etc discussed and encouraged; healthy living encouraged; see AVS for patient education given to patient -Discussed  importance of 150 minutes of physical activity weekly, eat two servings of fish weekly, eat one serving of tree nuts ( cashews, pistachios, pecans, almonds.SABRA) every other day, eat 6 servings of fruit/vegetables daily and drink plenty of water and avoid sweet beverages.  -Reviewed Health Maintenance: yes     [1]  Current Outpatient Medications:    ALPRAZolam  (NIRAVAM ) 0.25 MG dissolvable tablet, Take 1 tablet (0.25 mg total) by mouth at bedtime as needed for anxiety., Disp: 30 tablet, Rfl: 0   celecoxib  (CELEBREX ) 100 MG capsule, Take 1 capsule (100 mg total) by mouth 2 (two) times daily., Disp: 60 capsule, Rfl: 0   diclofenac  (VOLTAREN ) 75 MG EC tablet, Take 1 tablet (75 mg total) by mouth 2 (two) times daily., Disp: 50 tablet, Rfl: 2   busPIRone  (BUSPAR ) 5 MG tablet, TAKE 1 TABLET BY MOUTH TWICE A DAY (Patient not taking: Reported on 10/08/2024), Disp: 180 tablet, Rfl: 0 [2]  Allergies Allergen Reactions   Octacosanol    Octacosanol    Hydrocodone Rash

## 2024-10-09 ENCOUNTER — Ambulatory Visit: Payer: Self-pay | Admitting: Nurse Practitioner

## 2024-10-09 LAB — COMPREHENSIVE METABOLIC PANEL WITH GFR
AG Ratio: 2.2 (calc) (ref 1.0–2.5)
ALT: 35 U/L (ref 9–46)
AST: 17 U/L (ref 10–40)
Albumin: 4.7 g/dL (ref 3.6–5.1)
Alkaline phosphatase (APISO): 58 U/L (ref 36–130)
BUN: 14 mg/dL (ref 7–25)
CO2: 26 mmol/L (ref 20–32)
Calcium: 9.1 mg/dL (ref 8.6–10.3)
Chloride: 106 mmol/L (ref 98–110)
Creat: 0.91 mg/dL (ref 0.60–1.26)
Globulin: 2.1 g/dL (ref 1.9–3.7)
Glucose, Bld: 121 mg/dL — ABNORMAL HIGH (ref 65–99)
Potassium: 4.2 mmol/L (ref 3.5–5.3)
Sodium: 139 mmol/L (ref 135–146)
Total Bilirubin: 0.6 mg/dL (ref 0.2–1.2)
Total Protein: 6.8 g/dL (ref 6.1–8.1)
eGFR: 116 mL/min/{1.73_m2}

## 2024-10-09 LAB — LIPID PANEL
Cholesterol: 179 mg/dL
HDL: 31 mg/dL — ABNORMAL LOW
LDL Cholesterol (Calc): 123 mg/dL — ABNORMAL HIGH
Non-HDL Cholesterol (Calc): 148 mg/dL — ABNORMAL HIGH
Total CHOL/HDL Ratio: 5.8 (calc) — ABNORMAL HIGH
Triglycerides: 141 mg/dL

## 2024-10-09 LAB — CBC WITH DIFFERENTIAL/PLATELET
Absolute Lymphocytes: 1705 {cells}/uL (ref 850–3900)
Absolute Monocytes: 490 {cells}/uL (ref 200–950)
Basophils Absolute: 59 {cells}/uL (ref 0–200)
Basophils Relative: 1 %
Eosinophils Absolute: 171 {cells}/uL (ref 15–500)
Eosinophils Relative: 2.9 %
HCT: 48.3 % (ref 39.4–51.1)
Hemoglobin: 16.1 g/dL (ref 13.2–17.1)
MCH: 27.7 pg (ref 27.0–33.0)
MCHC: 33.3 g/dL (ref 31.6–35.4)
MCV: 83 fL (ref 81.4–101.7)
MPV: 9.8 fL (ref 7.5–12.5)
Monocytes Relative: 8.3 %
Neutro Abs: 3475 {cells}/uL (ref 1500–7800)
Neutrophils Relative %: 58.9 %
Platelets: 249 10*3/uL (ref 140–400)
RBC: 5.82 Million/uL — ABNORMAL HIGH (ref 4.20–5.80)
RDW: 12.5 % (ref 11.0–15.0)
Total Lymphocyte: 28.9 %
WBC: 5.9 10*3/uL (ref 3.8–10.8)

## 2024-10-09 LAB — HEMOGLOBIN A1C
Hgb A1c MFr Bld: 5.5 %
Mean Plasma Glucose: 111 mg/dL
eAG (mmol/L): 6.2 mmol/L

## 2024-10-09 LAB — MICROALBUMIN / CREATININE URINE RATIO
Creatinine, Urine: 189 mg/dL (ref 20–320)
Microalb Creat Ratio: 2 mg/g{creat}
Microalb, Ur: 0.3 mg/dL

## 2024-10-29 ENCOUNTER — Ambulatory Visit: Admitting: Podiatry

## 2024-12-10 ENCOUNTER — Ambulatory Visit: Admitting: Psychiatry

## 2025-04-08 ENCOUNTER — Ambulatory Visit: Admitting: Nurse Practitioner
